# Patient Record
Sex: Male | Born: 1960 | Race: Black or African American | Hispanic: No | Smoking: Former smoker
Health system: Southern US, Community
[De-identification: ages and names within clinical notes are randomized; demographics above are authoritative.]

## PROBLEM LIST (undated history)

## (undated) DIAGNOSIS — I10 Essential (primary) hypertension: Secondary | ICD-10-CM

## (undated) DIAGNOSIS — N19 Unspecified kidney failure: Secondary | ICD-10-CM

## (undated) DIAGNOSIS — E119 Type 2 diabetes mellitus without complications: Secondary | ICD-10-CM

## (undated) DIAGNOSIS — T300 Burn of unspecified body region, unspecified degree: Secondary | ICD-10-CM

## (undated) DIAGNOSIS — F259 Schizoaffective disorder, unspecified: Secondary | ICD-10-CM

## (undated) HISTORY — PX: DIALYSIS FISTULA CREATION: SHX611

## (undated) HISTORY — PX: SKIN GRAFT: SHX250

---

## 2003-04-10 ENCOUNTER — Ambulatory Visit (HOSPITAL_COMMUNITY): Admission: RE | Admit: 2003-04-10 | Discharge: 2003-04-10 | Payer: Self-pay | Admitting: Vascular Surgery

## 2003-04-10 ENCOUNTER — Encounter: Payer: Self-pay | Admitting: Vascular Surgery

## 2003-06-13 ENCOUNTER — Ambulatory Visit (HOSPITAL_COMMUNITY): Admission: RE | Admit: 2003-06-13 | Discharge: 2003-06-13 | Payer: Self-pay | Admitting: Vascular Surgery

## 2003-09-07 ENCOUNTER — Observation Stay (HOSPITAL_COMMUNITY): Admission: EM | Admit: 2003-09-07 | Discharge: 2003-09-07 | Payer: Self-pay | Admitting: Emergency Medicine

## 2003-10-06 ENCOUNTER — Ambulatory Visit (HOSPITAL_COMMUNITY): Admission: RE | Admit: 2003-10-06 | Discharge: 2003-10-06 | Payer: Self-pay | Admitting: Vascular Surgery

## 2003-11-17 ENCOUNTER — Ambulatory Visit (HOSPITAL_COMMUNITY): Admission: RE | Admit: 2003-11-17 | Discharge: 2003-11-17 | Payer: Self-pay | Admitting: Vascular Surgery

## 2003-11-27 ENCOUNTER — Ambulatory Visit (HOSPITAL_COMMUNITY): Admission: RE | Admit: 2003-11-27 | Discharge: 2003-11-27 | Payer: Self-pay | Admitting: Nephrology

## 2004-10-20 ENCOUNTER — Ambulatory Visit (HOSPITAL_COMMUNITY): Admission: RE | Admit: 2004-10-20 | Discharge: 2004-10-20 | Payer: Self-pay | Admitting: *Deleted

## 2004-11-21 ENCOUNTER — Ambulatory Visit (HOSPITAL_BASED_OUTPATIENT_CLINIC_OR_DEPARTMENT_OTHER): Admission: RE | Admit: 2004-11-21 | Discharge: 2004-11-21 | Payer: Self-pay | Admitting: Nephrology

## 2005-01-25 ENCOUNTER — Ambulatory Visit: Payer: Self-pay | Admitting: Internal Medicine

## 2005-01-27 ENCOUNTER — Emergency Department (HOSPITAL_COMMUNITY): Admission: EM | Admit: 2005-01-27 | Discharge: 2005-01-27 | Payer: Self-pay | Admitting: Emergency Medicine

## 2005-03-01 ENCOUNTER — Ambulatory Visit: Payer: Self-pay | Admitting: Internal Medicine

## 2005-04-28 ENCOUNTER — Ambulatory Visit (HOSPITAL_COMMUNITY): Admission: RE | Admit: 2005-04-28 | Discharge: 2005-04-28 | Payer: Self-pay | Admitting: Nephrology

## 2005-07-15 ENCOUNTER — Ambulatory Visit (HOSPITAL_COMMUNITY): Admission: RE | Admit: 2005-07-15 | Discharge: 2005-07-15 | Payer: Self-pay | Admitting: Vascular Surgery

## 2005-08-27 ENCOUNTER — Ambulatory Visit (HOSPITAL_COMMUNITY): Admission: EM | Admit: 2005-08-27 | Discharge: 2005-08-27 | Payer: Self-pay | Admitting: Vascular Surgery

## 2005-09-20 ENCOUNTER — Ambulatory Visit (HOSPITAL_COMMUNITY): Admission: RE | Admit: 2005-09-20 | Discharge: 2005-09-20 | Payer: Self-pay | Admitting: Nephrology

## 2005-09-22 ENCOUNTER — Ambulatory Visit (HOSPITAL_COMMUNITY): Admission: RE | Admit: 2005-09-22 | Discharge: 2005-09-22 | Payer: Self-pay | Admitting: *Deleted

## 2005-12-01 ENCOUNTER — Encounter: Admission: RE | Admit: 2005-12-01 | Discharge: 2005-12-01 | Payer: Self-pay | Admitting: Nephrology

## 2005-12-27 ENCOUNTER — Ambulatory Visit (HOSPITAL_COMMUNITY): Admission: RE | Admit: 2005-12-27 | Discharge: 2005-12-27 | Payer: Self-pay | Admitting: Vascular Surgery

## 2006-02-28 ENCOUNTER — Encounter: Admission: RE | Admit: 2006-02-28 | Discharge: 2006-02-28 | Payer: Self-pay | Admitting: Dentistry

## 2006-02-28 ENCOUNTER — Ambulatory Visit: Payer: Self-pay | Admitting: Dentistry

## 2006-03-02 ENCOUNTER — Ambulatory Visit (HOSPITAL_COMMUNITY): Admission: EM | Admit: 2006-03-02 | Discharge: 2006-03-02 | Payer: Self-pay | Admitting: Dentistry

## 2006-03-27 ENCOUNTER — Ambulatory Visit (HOSPITAL_COMMUNITY): Admission: RE | Admit: 2006-03-27 | Discharge: 2006-03-27 | Payer: Self-pay | Admitting: Vascular Surgery

## 2006-05-09 ENCOUNTER — Ambulatory Visit: Payer: Self-pay | Admitting: Dentistry

## 2006-05-29 ENCOUNTER — Ambulatory Visit (HOSPITAL_COMMUNITY): Admission: RE | Admit: 2006-05-29 | Discharge: 2006-05-29 | Payer: Self-pay | Admitting: Vascular Surgery

## 2006-06-07 ENCOUNTER — Ambulatory Visit (HOSPITAL_COMMUNITY): Admission: RE | Admit: 2006-06-07 | Discharge: 2006-06-07 | Payer: Self-pay | Admitting: Vascular Surgery

## 2006-07-28 ENCOUNTER — Ambulatory Visit (HOSPITAL_COMMUNITY): Admission: RE | Admit: 2006-07-28 | Discharge: 2006-07-28 | Payer: Self-pay | Admitting: Vascular Surgery

## 2006-08-31 ENCOUNTER — Ambulatory Visit (HOSPITAL_COMMUNITY): Admission: RE | Admit: 2006-08-31 | Discharge: 2006-08-31 | Payer: Self-pay | Admitting: Vascular Surgery

## 2007-01-16 ENCOUNTER — Emergency Department (HOSPITAL_COMMUNITY): Admission: EM | Admit: 2007-01-16 | Discharge: 2007-01-16 | Payer: Self-pay | Admitting: Emergency Medicine

## 2007-01-23 ENCOUNTER — Ambulatory Visit: Payer: Self-pay | Admitting: Dentistry

## 2007-06-10 ENCOUNTER — Emergency Department (HOSPITAL_COMMUNITY): Admission: EM | Admit: 2007-06-10 | Discharge: 2007-06-10 | Payer: Self-pay | Admitting: *Deleted

## 2007-12-27 HISTORY — PX: KIDNEY TRANSPLANT: SHX239

## 2009-01-21 ENCOUNTER — Ambulatory Visit: Payer: Self-pay | Admitting: Critical Care Medicine

## 2009-01-21 ENCOUNTER — Inpatient Hospital Stay (HOSPITAL_COMMUNITY): Admission: AD | Admit: 2009-01-21 | Discharge: 2009-01-26 | Payer: Self-pay | Admitting: Pulmonary Disease

## 2009-01-21 ENCOUNTER — Encounter: Payer: Self-pay | Admitting: Emergency Medicine

## 2009-02-09 ENCOUNTER — Encounter: Admission: RE | Admit: 2009-02-09 | Discharge: 2009-03-03 | Payer: Self-pay | Admitting: Pulmonary Disease

## 2009-05-05 ENCOUNTER — Encounter: Admission: RE | Admit: 2009-05-05 | Discharge: 2009-05-05 | Payer: Self-pay | Admitting: Pulmonary Disease

## 2011-01-15 ENCOUNTER — Encounter: Payer: Self-pay | Admitting: Nephrology

## 2011-04-11 LAB — CULTURE, BLOOD (ROUTINE X 2)
Culture: NO GROWTH
Culture: NO GROWTH

## 2011-04-11 LAB — COMPREHENSIVE METABOLIC PANEL
ALT: 20 U/L (ref 0–53)
ALT: 20 U/L (ref 0–53)
AST: 29 U/L (ref 0–37)
Alkaline Phosphatase: 103 U/L (ref 39–117)
BUN: 54 mg/dL — ABNORMAL HIGH (ref 6–23)
CO2: 21 mEq/L (ref 19–32)
CO2: 15 mEq/L — ABNORMAL LOW (ref 19–32)
Calcium: 9.2 mg/dL (ref 8.4–10.5)
Chloride: 113 mEq/L — ABNORMAL HIGH (ref 96–112)
Creatinine, Ser: 2.86 mg/dL — ABNORMAL HIGH (ref 0.4–1.5)
GFR calc non Af Amer: >60 mL/min (ref >60)
GFR calc non Af Amer: 24 mL/min — ABNORMAL LOW (ref >60)
Glucose, Bld: 213 mg/dL — ABNORMAL HIGH (ref 70–99)
Glucose, Bld: 1304 mg/dL (ref 70–99)
Potassium: 3 mEq/L — ABNORMAL LOW (ref 3.5–5.1)
Sodium: 141 mEq/L (ref 135–145)
Total Bilirubin: 0.9 mg/dL (ref 0.3–1.2)
Total Protein: 7.2 g/dL (ref 6.0–8.3)

## 2011-04-11 LAB — BASIC METABOLIC PANEL
BUN: 49 mg/dL — ABNORMAL HIGH (ref 6–23)
BUN: 52 mg/dL — ABNORMAL HIGH (ref 6–23)
BUN: 54 mg/dL — ABNORMAL HIGH (ref 6–23)
CO2: 17 mEq/L — ABNORMAL LOW (ref 19–32)
CO2: 20 mEq/L (ref 19–32)
Calcium: 9.2 mg/dL (ref 8.4–10.5)
Calcium: 9.4 mg/dL (ref 8.4–10.5)
Calcium: 9.4 mg/dL (ref 8.4–10.5)
Calcium: 9.5 mg/dL (ref 8.4–10.5)
Calcium: 9.9 mg/dL (ref 8.4–10.5)
Chloride: 106 mEq/L (ref 96–112)
Chloride: 115 mEq/L — ABNORMAL HIGH (ref 96–112)
Creatinine, Ser: 1.11 mg/dL (ref 0.4–1.5)
Creatinine, Ser: 1.88 mg/dL — ABNORMAL HIGH (ref 0.4–1.5)
Creatinine, Ser: 2.04 mg/dL — ABNORMAL HIGH (ref 0.4–1.5)
Creatinine, Ser: 2.34 mg/dL — ABNORMAL HIGH (ref 0.4–1.5)
Creatinine, Ser: 2.65 mg/dL — ABNORMAL HIGH (ref 0.4–1.5)
GFR calc Af Amer: 31 mL/min — ABNORMAL LOW (ref >60)
GFR calc Af Amer: 47 mL/min — ABNORMAL LOW (ref >60)
GFR calc Af Amer: >60 mL/min (ref >60)
GFR calc non Af Amer: 26 mL/min — ABNORMAL LOW (ref >60)
GFR calc non Af Amer: 30 mL/min — ABNORMAL LOW (ref >60)
GFR calc non Af Amer: 35 mL/min — ABNORMAL LOW (ref >60)
GFR calc non Af Amer: >60 mL/min (ref >60)
Glucose, Bld: 191 mg/dL — ABNORMAL HIGH (ref 70–99)
Glucose, Bld: 451 mg/dL — ABNORMAL HIGH (ref 70–99)
Glucose, Bld: 764 mg/dL (ref 70–99)
Potassium: 3.9 mEq/L (ref 3.5–5.1)
Potassium: 4.2 mEq/L (ref 3.5–5.1)
Sodium: 140 mEq/L (ref 135–145)
Sodium: 143 mEq/L (ref 135–145)
Sodium: 146 mEq/L — ABNORMAL HIGH (ref 135–145)

## 2011-04-11 LAB — CBC
HCT: 43.4 % (ref 39.0–52.0)
Hemoglobin: 13.4 g/dL (ref 13.0–17.0)
Hemoglobin: 16.7 g/dL (ref 13.0–17.0)
MCHC: 33 g/dL (ref 30.0–36.0)
MCHC: 33.3 g/dL (ref 30.0–36.0)
MCHC: 31.1 g/dL (ref 30.0–36.0)
MCV: 85.6 fL (ref 78.0–100.0)
MCV: 85.7 fL (ref 78.0–100.0)
MCV: 91 fL (ref 78.0–100.0)
RBC: 4.69 MIL/uL (ref 4.22–5.81)
RBC: 5.07 MIL/uL (ref 4.22–5.81)
RBC: 5.72 MIL/uL (ref 4.22–5.81)
RBC: 5.92 MIL/uL — ABNORMAL HIGH (ref 4.22–5.81)
RDW: 14.7 % (ref 11.5–15.5)
WBC: 14.1 10*3/uL — ABNORMAL HIGH (ref 4.0–10.5)
WBC: 6.1 10*3/uL (ref 4.0–10.5)

## 2011-04-11 LAB — GLUCOSE, CAPILLARY
Glucose-Capillary: 116 mg/dL — ABNORMAL HIGH (ref 70–99)
Glucose-Capillary: 147 mg/dL — ABNORMAL HIGH (ref 70–99)
Glucose-Capillary: 147 mg/dL — ABNORMAL HIGH (ref 70–99)
Glucose-Capillary: 165 mg/dL — ABNORMAL HIGH (ref 70–99)
Glucose-Capillary: 173 mg/dL — ABNORMAL HIGH (ref 70–99)
Glucose-Capillary: 190 mg/dL — ABNORMAL HIGH (ref 70–99)
Glucose-Capillary: 247 mg/dL — ABNORMAL HIGH (ref 70–99)
Glucose-Capillary: 284 mg/dL — ABNORMAL HIGH (ref 70–99)
Glucose-Capillary: 286 mg/dL — ABNORMAL HIGH (ref 70–99)
Glucose-Capillary: 400 mg/dL — ABNORMAL HIGH (ref 70–99)
Glucose-Capillary: 414 mg/dL — ABNORMAL HIGH (ref 70–99)
Glucose-Capillary: 458 mg/dL — ABNORMAL HIGH (ref 70–99)
Glucose-Capillary: 515 mg/dL (ref 70–99)
Glucose-Capillary: 569 mg/dL (ref 70–99)
Glucose-Capillary: 593 mg/dL (ref 70–99)
Glucose-Capillary: >600 mg/dL (ref 70–99)
Glucose-Capillary: >600 mg/dL (ref 70–99)
Glucose-Capillary: >600 mg/dL (ref 70–99)

## 2011-04-11 LAB — POCT I-STAT 3, ART BLOOD GAS (G3+)
Acid-base deficit: 9 mmol/L — ABNORMAL HIGH (ref 0.0–2.0)
Bicarbonate: 14.9 mEq/L — ABNORMAL LOW (ref 20.0–24.0)
O2 Saturation: 98 %
Patient temperature: 98
TCO2: 16 mmol/L (ref 0–100)
pCO2 arterial: 27.9 mmHg — ABNORMAL LOW (ref 35.0–45.0)
pH, Arterial: 7.334 — ABNORMAL LOW (ref 7.350–7.450)
pO2, Arterial: 102 mmHg — ABNORMAL HIGH (ref 80.0–100.0)

## 2011-04-11 LAB — URINE CULTURE

## 2011-04-11 LAB — URINE MICROSCOPIC-ADD ON

## 2011-04-11 LAB — URINALYSIS, ROUTINE W REFLEX MICROSCOPIC
Bilirubin Urine: NEGATIVE
Ketones, ur: 15 mg/dL — AB
Leukocytes, UA: NEGATIVE
Nitrite: NEGATIVE
Specific Gravity, Urine: 1.025 (ref 1.005–1.030)
Urobilinogen, UA: 0.2 mg/dL (ref 0.0–1.0)
pH: 5.5 (ref 5.0–8.0)

## 2011-04-11 LAB — MAGNESIUM: Magnesium: 1.9 mg/dL (ref 1.5–2.5)

## 2011-04-11 LAB — RENAL FUNCTION PANEL
Albumin: 2.9 g/dL — ABNORMAL LOW (ref 3.5–5.2)
BUN: 38 mg/dL — ABNORMAL HIGH (ref 6–23)
Chloride: 105 mEq/L (ref 96–112)
Glucose, Bld: 538 mg/dL (ref 70–99)
Potassium: 4 mEq/L (ref 3.5–5.1)

## 2011-04-11 LAB — DIFFERENTIAL
Eosinophils Absolute: 0 10*3/uL (ref 0.0–0.7)
Lymphocytes Relative: 5 % — ABNORMAL LOW (ref 12–46)
Lymphs Abs: 0.6 10*3/uL — ABNORMAL LOW (ref 0.7–4.0)
Monocytes Relative: 3 % (ref 3–12)
Neutro Abs: 11.3 10*3/uL — ABNORMAL HIGH (ref 1.7–7.7)
Neutrophils Relative %: 92 % — ABNORMAL HIGH (ref 43–77)

## 2011-04-11 LAB — BLOOD GAS, ARTERIAL
Acid-base deficit: 13.4 mmol/L — ABNORMAL HIGH (ref 0.0–2.0)
Drawn by: 129801
O2 Content: 2 L/min
pCO2 arterial: 28.9 mmHg — ABNORMAL LOW (ref 35.0–45.0)

## 2011-04-11 LAB — CARDIAC PANEL(CRET KIN+CKTOT+MB+TROPI)
Relative Index: 4.2 — ABNORMAL HIGH (ref 0.0–2.5)
Total CK: 261 U/L — ABNORMAL HIGH (ref 7–232)
Troponin I: 0.04 ng/mL (ref 0.00–0.06)
Troponin I: 0.06 ng/mL (ref 0.00–0.06)

## 2011-04-11 LAB — KETONES, QUALITATIVE

## 2011-04-11 LAB — LIPASE, BLOOD: Lipase: 46 U/L (ref 11–59)

## 2011-04-11 LAB — LACTIC ACID, PLASMA: Lactic Acid, Venous: 2.8 mmol/L — ABNORMAL HIGH (ref 0.5–2.2)

## 2011-04-11 LAB — CK TOTAL AND CKMB (NOT AT ARMC)
CK, MB: 5.9 ng/mL — ABNORMAL HIGH (ref 0.3–4.0)
Relative Index: 1.9 (ref 0.0–2.5)
Total CK: 317 U/L — ABNORMAL HIGH (ref 7–232)

## 2011-04-11 LAB — PHOSPHORUS: Phosphorus: 3.1 mg/dL (ref 2.3–4.6)

## 2011-04-11 LAB — TROPONIN I: Troponin I: 0.03 ng/mL (ref 0.00–0.06)

## 2011-04-12 LAB — CBC
HCT: 36.3 % — ABNORMAL LOW (ref 39.0–52.0)
MCV: 85.2 fL (ref 78.0–100.0)
Platelets: 201 10*3/uL (ref 150–400)
RDW: 13.5 % (ref 11.5–15.5)

## 2011-04-12 LAB — BASIC METABOLIC PANEL
BUN: 9 mg/dL (ref 6–23)
Chloride: 110 mEq/L (ref 96–112)
Creatinine, Ser: 1.06 mg/dL (ref 0.4–1.5)
GFR calc non Af Amer: >60 mL/min (ref >60)
Glucose, Bld: 138 mg/dL — ABNORMAL HIGH (ref 70–99)
Potassium: 4 mEq/L (ref 3.5–5.1)

## 2011-04-12 LAB — GLUCOSE, CAPILLARY: Glucose-Capillary: 139 mg/dL — ABNORMAL HIGH (ref 70–99)

## 2011-05-10 NOTE — Discharge Summary (Signed)
[name], Lewis               ACCOUNT NO.:  [id number]   MEDICAL RECORD NO.:  [id number]          PATIENT TYPE:  INP   LOCATION:  5159                         FACILITY:  [facility]   PHYSICIAN:  Marcellus Scott, MD     DATE OF BIRTH:  [birth year]   DATE OF ADMISSION:  01/21/2009  DATE OF DISCHARGE:  01/26/2009                               DISCHARGE SUMMARY   ADDENDUM:  Primary medical doctor is unassigned.   The patient's transplant surgeon is Dr. Rennis Harding at [facility].  His  transplant coordinator is Darel Hong, telephone number [phone].   This is an addendum to the interim discharge summary that was done by  the critical care medicine team on January 23, 2009.  Please refer to  those notes for prior details.  This summary will update events since  January 30 to date.   DISCHARGE DIAGNOSES:  1. Newly diagnosed insulin-requiring diabetes mellitus/type 1.  2. Diabetic ketoacidosis, resolved.  3. Dehydration, resolved.  4. Acute renal failure, non-oliguric, resolved.  5. Hypertension, controlled.  6. Hypokalemia, repleted.  7. Status post renal transplant on chronic prednisone and      immunosuppressive medications.   DISCHARGE MEDICATIONS:  1. Lantus 45 units subcutaneously b.i.d.  2. Novolog 6 units t.i.d. with meals.  3. Novolog sliding scale insulin as per directions.  Resistance scale      prescribed.  4. Lasix reduced to 40 mg p.o. daily until follow-up with transplant      M.D.  5. Prednisone reduced to 5 mg p.o. daily.  6. Cellcept 750 mg p.o. b.i.d.  7. Metoprolol 100 mg p.o. b.i.d.  8. Enteric-coated aspirin 81 mg p.o. daily.  9. Bactrim double-strength 1 tablet p.o. on Monday, Wednesday, Friday.  10.Magnesium oxide 400 mg p.o. b.i.d.  11.Amlodipine 10 mg p.o. daily.  12.Prograf 2 mg p.o. b.i.d.  13.Simvastatin 80 mg p.o. daily.  14.Lisinopril 10 mg p.o. daily.   PROCEDURES:  Since January 30 none.   PERTINENT LABORATORIES:  Basic metabolic panel today with  sodium 142,  potassium 4, chloride 110, bicarb 27, glucose 138, BUN 9, creatinine  1.06, calcium is 9.1.  CBC with hemoglobin 12.3, hematocrit 36.3, white  blood cells 6.3, platelets 201.  Magnesium yesterday was 1.8.  Urine  cultures:  No growth.  Serum lipase 46, amylase 35.  Blood cultures:  No  growth to date x2.   CONSULTATIONS:  None.   HOSPITAL COURSE AND PATIENT DISPOSITION:  [name] is a pleasant 67-  year-old African American male patient who initially was admitted by the  critical care  medicine team.  He presented with polyuria, polydipsia  and polyphagia with diabetic ketoacidosis and acute renal failure  secondary dehydration.  His initial blood sugars were 1300mg /dl.  He was  admitted to the intensive care unit, aggressively hydrated with IV  fluids and treated with IV insulin.  Once his anion gap closed and his  mental status had improved and strength improved, he was taken off the  insulin drip and switched to basal Lantus and Novolog mealtime and  sliding scale insulin.  However, his sugars  continued to be elevated at  the point of transfer to the hospitalist team on January 30.  Further  adjustments were made.  Currently his CBGs range between 134 to 238  mg/dL.  The patient has been extensively educated regarding checking his  CBGs, self-administration of insulin, monitoring for hypoglycemic  symptoms and management of same.  He is currently asymptomatic.  The  etiology of this newly diagnosed diabetes is unclear.  Question  secondary to steroid-induced diabetes.  The critical care team, Dr.  Felipa Eth, discussed his care with Dr. Rennis Harding on January 28.  He was told that  his baseline creatinine was 1.3 and the patient is actually supposed to  be on 5 mg of prednisone.  The patient will be discharged on 5 mg of  prednisone.  We were also made aware per the interim discharge summary  that [facility] will manage diabetic control and the rest of his medical care.  As indicated,  he has to call the transplant coordinator as soon as  possible for an appointment to be followed up by the end of this week or  early next week.   Diabetic ketoacidosis, resolved.   Dehydration, resolved.   Acute renal failure.  The patient's creatinine was much improved.  He  apparently has a baseline creatinine of 1.3.  His Lasix and ACE  inhibitors were obviously held.  Currently he is euvolemic and eating  and drinking well.  We will re-institute lisinopril and Lasix at a  reduced dose until he follows up with his transplant MDs.   Status post renal transplant.  The patient has continued on his  immunosuppressive medications, including Prograf, Cellcept, prednisone.  He is also continued on p.o. Bactrim.   The patient at this time is stable to discharge  home.  He has been  advised to seek immediate medical attention if there is any  deterioration in his condition.   Time Spent: 40 mins      Marcellus Scott, MD  Electronically Signed     AH/MEDQ  D:  01/26/2009  T:  01/26/2009  Job:  [id number]   cc:   Lannie Fields

## 2011-05-10 NOTE — Discharge Summary (Signed)
NAMEZACKARIAH, [name]               ACCOUNT NO.:  [id number]   MEDICAL RECORD NO.:  [id number]          PATIENT TYPE:  INP   LOCATION:  5159                         FACILITY:  [facility]   PHYSICIAN:  Coralyn Helling, MD        DATE OF BIRTH:  [birth year]   DATE OF ADMISSION:  01/21/2009  DATE OF DISCHARGE:                               DISCHARGE SUMMARY   DISCHARGE DIAGNOSES:  1. New-onset diabetes with profound hyperglycemia and resultant      diabetic ketoacidosis.  2. Profound dehydration secondary to history of renal transplant.  3. History of renal transplant.  4. Hypertension.   LABORATORY DATA:  Blood glucose fasting on January 23, 2009, 360.  At  bedtime glucose on January 22, 2009, 569.  Note, this is after  transitioning off from insulin drips.  Sodium 132, potassium 4, chloride  105, CO2 16, creatinine 1.81 down from 1.88, glucose fasting at 538,  magnesium 2.5, and BUN 38.   BRIEF HISTORY:  A 50 year old male patient presented with chief  complaint of weakness, polyuria, polydipsia, and polyphagia.  The  symptoms were progressive over approximately a week.  Initial glucose  found to be 1300, in active diabetic ketoacidosis by laboratory  evaluation.  He has a significant history of end-stage renal disease,  formally dialysis dependent.  However, status post renal transplant in  2009 at [facility].  He does not see physicians  locally, however, is followed at [facility].  At any rate, he was admitted to  the Critical Care Service given his profound dehydration and metabolic  acidosis.   HOSPITAL COURSE BY DISCHARGE DIAGNOSES:  1. Diabetes type 2 with profound hyperglycemia, and now status post      diabetic ketoacidosis.  Mr. [name] was admitted initially to the      intensive care and treated in the usual fashion with aggressive IV      fluid resuscitation and insulin administration.  His anion gap      closed,  his mental status improved, strength improved,  and      glycemic control slowly improving.  Of note, following      discontinuation of insulin drip, there has been some difficulty      controlling his capillary blood glucoses, most specifically      capillary blood glucose prior to bedtime of 569 and a fasting      capillary blood glucose of 360.  This was noted on the day of      dictation.  Plan for this at this time is to continue to adjust      basal and sliding scale insulin regimen.  Additionally, we will      check amylase and lipase to be certain that there is an occult      pancreatitis contributing to new onset of hyperglycemia.  He has      yet to initiate insulin teaching and will require injection      teaching, dose teaching, and education around all facets of insulin      administration.  2. Status post renal transplant.  For this, his creatinine did      marginally bump.  He has a baseline creatinine noted in our records      at 1.3.  He is 1.81, however, this has improved with IV fluid      resuscitation following profound dehydration as mentioned above.      From a General Medicine and Nephrology standpoint,  He will be      followed at [facility] by their transplant      team.  For this, we have already been in contact with [facility].  He is      seen by Dr. Rennis Harding who is a transplant surgeon.  He is to be      discharged to home on 5 mg of prednisone a day.  He will need to      contact Darel Hong, the transplant coordinator at [phone] upon time      of discharge for followup.  [facility] is planning on managing glycemic      control in addition to all facets of his medical care upon time of      discharge.  3. Hypertension.  Plan for this is to continue current regimen.   DIET:  Carbohydrate modified, with focus on glycemic teaching.   DISCHARGE MEDICATIONS:  Currently, we will defer this to time of actual  discharge as it is unclear what his insulin requirements will be.   DISPOSITION:  Mr. Fiumara has  made maximum benefit from Critical Care  Services.  He continues to require Internal Medicine assistance and  therefore he will be transitioned to the [facility] Internal Medicine  Service.      [name], NP      Coralyn Helling, MD  Electronically Signed    PB/MEDQ  D:  01/23/2009  T:  01/23/2009  Job:  [id number]

## 2011-05-10 NOTE — H&P (Signed)
[name], [name]               ACCOUNT NO.:  [id number]   MEDICAL RECORD NO.:  [id number]          PATIENT TYPE:  EMS   LOCATION:  ED                           FAC[facility]  [facility]   PHYSICIAN:  Charlcie Cradle. Delford Field, MD, FCCPDATE OF BIRTH:  12/13/1961   DATE OF ADMISSION:  01/21/2009  DATE OF DISCHARGE:                              HISTORY & PHYSICAL   HISTORY OF PRESENT ILLNESS:  This is a 50 year old male who presents  with chief complaint of weakness.  He has not been eating for several  days.  He has noted polyuria, dipsia, and polyphagia.  His symptoms  began progressively over a week.  Symptoms were onset was gradual.  He  developed increased lethargy.  Family brought him in and was found to  have a blood sugar of 1300 in active DKA.  History of chronic renal  failure with end-stage renal disease, dialysis dependent in the past,  but in April 2009 he had a renal transplant at [facility],  cadaveric.  He is now off dialysis, is followed exclusively in the [facility]  system, does not see physicians locally here.  He came to the [facility] emergency room because of progressive lethargy and found to have to  have a blood sugar of 1300.  Admitted for inpatient care because of low  blood pressure.  The critical care medicine service was consulted.   PAST MEDICAL HISTORY:  1. Hypertension.  2. Hyperlipidemia.  3. End-stage renal disease on the basis of hypertension.  4. Chronic access graft issues for many years in the right arm.  5. Right IJ access.   ALLERGIES:  None.   MEDICATION PRIOR TO ADMISSION:  1. Prednisone 10 mg daily.  2. Prograf 10 mg b.i.d.  3. CellCept 750 mg b.i.d.  4. Magnesium supplementation.  5. Prilosec 20 mg daily.  6. Zocor 80 mg daily.  7. Aspirin 81 mg daily.  8. Norvasc 10 mg daily.  9. Lisinopril 10 mg daily.   Medical history is notable in that patient does not have a history of  diabetes.   SOCIAL HISTORY, FAMILY HISTORY, REVIEW OF  SYSTEMS:  Otherwise  noncontributory.  Patient lives at home by himself.   PHYSICAL EXAMINATION:  This is an ill-appearing male.  Blood pressure  initially was 78/47, now 90/60.  Pulse 90, respirations 24.  CHEST:  Distant breath sounds.  No wheeze, rale or rhonchi.  CARDIAC:  Regular rate and rhythm without S3.  Normal S1 and S2.  HEENT:  Oral mucous membranes are dry.  NECK:  Supple.  CHEST:  Clear without evidence of wheeze, rale, or rhonchi.  CARDIAC:  Regular rate and rhythm.  Tachycardia without S3.  Normal S1,  S2.  ABDOMEN:  Soft, nontender.  EXTREMITIES:  No edema, clubbing or obesity.  SKIN:  Clear.  NEUROLOGIC:  Patient is lethargic but intact.  Does smell like acetone.   LABORATORY DATA:  Sodium 130, potassium 5.5, chloride 91, CO2 15, blood  sugar 1300, BUN 54, creatinine 2.86, calcium 9.2.  Chest x-ray was  clear.  No active disease.  EKG is not yet obtained.   IMPRESSION:  Diabetic ketoacidosis with metabolic acidosis, severe  volume depletion, renal insufficiency on the basis of volume depletion.  No diabetes history is noted.  Underlying renal transplant with  creatinine of 3 now.  End-stage renal disease in the past due to  hypertension.  Does have leukocytosis.   RECOMMENDATIONS:  Check urinalysis.  Check urine culture, blood culture.  No antibiotics as of yet.  Get full volume support with IV fluids,  saline.  DKA protocol.  Admit to ICU at [facility].  Obtain renal  consultation in followup.  Administer and obtain complete active  medication reconciliation.  Notify the [facility].      Charlcie Cradle Delford Field, MD, [facility]  Electronically Signed     PEW/MEDQ  D:  01/21/2009  T:  01/21/2009  Job:  [phone] [id number]   cc:   [facility] Department of Renal Medicine

## 2011-05-13 NOTE — Procedures (Signed)
NAMEMERLIN, GOLDEN               ACCOUNT NO.:  [id number]   MEDICAL RECORD NO.:  [id number]          PATIENT TYPE:  OUT   LOCATION:  SLEEP CENTER                 FACILITY:  [facility]   PHYSICIAN:  Clinton D. Maple Hudson, M.D. DATE OF BIRTH:  08/12/1961   DATE OF STUDY:  11/21/2004                              NOCTURNAL POLYSOMNOGRAM   STUDY DATE:  November 21, 2004   REFERRING PHYSICIAN:  Dr. Camille Bal   INDICATION FOR STUDY:  Hypersomnia with sleep apnea.   EPWORTH SLEEPINESS SCORE:  9/24   BODY MASS INDEX:  27.5   WEIGHT:  215 pounds   NECK SIZE:  17-1/2 inches   SLEEP ARCHITECTURE:  Total sleep time 314 minutes with sleep efficiency 66%.  Stage I was 5%, stage II 69%, stages III and IV 3%, REM was 23% of total  sleep time.  Sleep latency 55 minutes, REM latency 137 minutes, awake after  sleep onset 110 minutes, arousal index 30.   RESPIRATORY DATA:  Split-study protocol.  RDI 77.8/hr indicating severe  obstructive sleep apnea/hypopnea before CPAP.  This included 49 obstructive  apneas and 126 hypopneas before CPAP.  Events were not positional.  REM RDI  13.9/hr.  CPAP is titrated to 8 CWP, RDI 0.4/hr using a medium ComfortGel  Mask with heated humidifier.   OXYGEN DATA:  Mild snoring with oxygen desaturation to a nadir of 73% before  CPAP.  After CPAP titration oxygen saturation held 96-98% on room air.   CARDIAC DATA:  Normal sinus rhythm.   MOVEMENT/PARASOMNIA:  Occasional leg jerk with insignificant effect on  sleep.  Bathroom x2.   IMPRESSION/RECOMMENDATION:  Severe obstructive sleep apnea/hypopnea  syndrome, respiratory disturbance index 77.8/hr with desaturation to 73%.  Continuous positive airway pressure titration to 8 CWP, respiratory  disturbance index 0.4/hr using a medium ComfortGel Mask with heated  humidifier.                                                           Clinton D. Maple Hudson, M.D.  Diplomate, [employer]   CDY/MEDQ  D:  11/28/2004  10:26:29  T:  11/28/2004 11:52:29  Job:  [id number]

## 2011-05-13 NOTE — Op Note (Signed)
[name], [name]               ACCOUNT NO.:  [id number]   MEDICAL RECORD NO.:  [id number]          PATIENT TYPE:  AMB   LOCATION:  SDS                          FACILITY:  [facility]   PHYSICIAN:  Quita Skye. Hart Rochester, M.D.  DATE OF BIRTH:  1961-04-12   DATE OF PROCEDURE:  07/28/2006  DATE OF DISCHARGE:                                 OPERATIVE REPORT   PREOPERATIVE DIAGNOSIS:  End-stage renal disease.   POSTOPERATIVE DIAGNOSIS:  End-stage renal disease.   OPERATION:  Insertion of a new right upper arm arteriovenous Gore-Tex graft  from brachial artery to axillary vein of 6 mm Gore-Tex.   SURGEON:  Quita Skye. Hart Rochester, M.D.   FIRST ASSISTANT:  Theda Belfast, PA   ANESTHESIA:  Local.   PROCEDURE:  The patient was taken to the operating room and placed in the  supine position, at which time the right upper extremity was prepped with  Betadine scrub and solution and draped in a routine sterile manner.  After  infiltration with 1% Xylocaine, a longitudinal incision was made the distal  upper arm over the brachial artery.  The brachial artery was a small vessel,  somewhat thickened and it was dissected free proximally and distally but did  have an excellent pulse.  There was no evidence of any adequate basilic vein  in this area.  A second incision was made just distal to the axilla and the  axillary vein was dissected free down to the high brachial level and was a  4.5-5 mm vein, adequate for grafting.  A curvilinear tunnel was created on  the anterior aspect of the arm after infiltration of 0.5% Xylocaine and 3000  units of heparin given intravenously.  A 6 mm Gore-Tex graft was delivered  through the tunnel.  The artery was occluded proximally and distally with  vessel loops, opened with a 15 blade, extended with Potts scissors.  The 6  mm graft anastomosed end-to-side with 6-0 Prolene.  Following this the vein  was ligated distally and transected, slightly spatulated, and the graft  was  spatulated and anastomosed end-to-end with 6-0 Prolene.  Clamps were  released.  There was an excellent pulse and thrill in the graft and good  brachial pulse distal to the graft.  No protamine was given.  The wounds  were irrigated with saline, closed in layers with Vicryl in a subcuticular  fashion, sterile dressing applied.  The patient taken to the recovery room  in satisfactory condition.           ______________________________  Quita Skye Hart Rochester, M.D.     JDL/MEDQ  D:  07/28/2006  T:  07/28/2006  Job:  [id number]

## 2013-10-04 DIAGNOSIS — Z940 Kidney transplant status: Secondary | ICD-10-CM | POA: Diagnosis not present

## 2013-10-04 DIAGNOSIS — I10 Essential (primary) hypertension: Secondary | ICD-10-CM | POA: Diagnosis not present

## 2014-03-12 DIAGNOSIS — Z8639 Personal history of other endocrine, nutritional and metabolic disease: Secondary | ICD-10-CM | POA: Diagnosis not present

## 2014-03-12 DIAGNOSIS — Z940 Kidney transplant status: Secondary | ICD-10-CM | POA: Diagnosis not present

## 2014-03-12 DIAGNOSIS — D899 Disorder involving the immune mechanism, unspecified: Secondary | ICD-10-CM | POA: Diagnosis not present

## 2014-03-12 DIAGNOSIS — I129 Hypertensive chronic kidney disease with stage 1 through stage 4 chronic kidney disease, or unspecified chronic kidney disease: Secondary | ICD-10-CM | POA: Diagnosis not present

## 2014-03-12 DIAGNOSIS — Z862 Personal history of diseases of the blood and blood-forming organs and certain disorders involving the immune mechanism: Secondary | ICD-10-CM | POA: Diagnosis not present

## 2014-03-12 DIAGNOSIS — E785 Hyperlipidemia, unspecified: Secondary | ICD-10-CM | POA: Diagnosis not present

## 2014-05-01 ENCOUNTER — Encounter (HOSPITAL_COMMUNITY): Payer: Self-pay | Admitting: Emergency Medicine

## 2014-05-01 ENCOUNTER — Emergency Department (HOSPITAL_COMMUNITY): Payer: Medicare Other

## 2014-05-01 ENCOUNTER — Emergency Department (HOSPITAL_COMMUNITY)
Admission: EM | Admit: 2014-05-01 | Discharge: 2014-05-01 | Disposition: A | Discharge status: Home / Self Care | Payer: Medicare Other | Attending: Emergency Medicine | Admitting: Emergency Medicine

## 2014-05-01 DIAGNOSIS — M5430 Sciatica, unspecified side: Secondary | ICD-10-CM | POA: Insufficient documentation

## 2014-05-01 DIAGNOSIS — I120 Hypertensive chronic kidney disease with stage 5 chronic kidney disease or end stage renal disease: Secondary | ICD-10-CM | POA: Diagnosis not present

## 2014-05-01 DIAGNOSIS — Z992 Dependence on renal dialysis: Secondary | ICD-10-CM | POA: Insufficient documentation

## 2014-05-01 DIAGNOSIS — N186 End stage renal disease: Secondary | ICD-10-CM | POA: Diagnosis not present

## 2014-05-01 DIAGNOSIS — Z940 Kidney transplant status: Secondary | ICD-10-CM | POA: Diagnosis not present

## 2014-05-01 DIAGNOSIS — S335XXA Sprain of ligaments of lumbar spine, initial encounter: Secondary | ICD-10-CM | POA: Diagnosis not present

## 2014-05-01 DIAGNOSIS — I10 Essential (primary) hypertension: Secondary | ICD-10-CM | POA: Diagnosis not present

## 2014-05-01 DIAGNOSIS — Z79899 Other long term (current) drug therapy: Secondary | ICD-10-CM | POA: Diagnosis not present

## 2014-05-01 DIAGNOSIS — E119 Type 2 diabetes mellitus without complications: Secondary | ICD-10-CM | POA: Insufficient documentation

## 2014-05-01 DIAGNOSIS — X503XXA Overexertion from repetitive movements, initial encounter: Secondary | ICD-10-CM | POA: Insufficient documentation

## 2014-05-01 DIAGNOSIS — T148XXA Other injury of unspecified body region, initial encounter: Secondary | ICD-10-CM

## 2014-05-01 DIAGNOSIS — M4310 Spondylolisthesis, site unspecified: Secondary | ICD-10-CM | POA: Diagnosis not present

## 2014-05-01 DIAGNOSIS — Z87828 Personal history of other (healed) physical injury and trauma: Secondary | ICD-10-CM | POA: Insufficient documentation

## 2014-05-01 DIAGNOSIS — IMO0002 Reserved for concepts with insufficient information to code with codable children: Secondary | ICD-10-CM | POA: Diagnosis not present

## 2014-05-01 DIAGNOSIS — Y929 Unspecified place or not applicable: Secondary | ICD-10-CM | POA: Insufficient documentation

## 2014-05-01 DIAGNOSIS — Y9389 Activity, other specified: Secondary | ICD-10-CM | POA: Insufficient documentation

## 2014-05-01 HISTORY — DX: Burn of unspecified body region, unspecified degree: T30.0

## 2014-05-01 HISTORY — DX: Type 2 diabetes mellitus without complications: E11.9

## 2014-05-01 HISTORY — DX: Essential (primary) hypertension: I10

## 2014-05-01 HISTORY — DX: Unspecified kidney failure: N19

## 2014-05-01 MED ORDER — OXYCODONE-ACETAMINOPHEN 5-325 MG PO TABS: 1.0000 | ORAL_TABLET | Freq: Four times a day (QID) | ORAL | Status: DC | PRN

## 2014-05-01 MED ORDER — OXYCODONE-ACETAMINOPHEN 5-325 MG PO TABS
1.0000 | ORAL_TABLET | Freq: Once | ORAL | Status: AC
  Administered 2014-05-01: 1 via ORAL
  Filled 2014-05-01: qty 1

## 2014-05-01 MED ORDER — PREDNISONE 50 MG PO TABS: 50.0000 mg | ORAL_TABLET | Freq: Every day | ORAL | Status: DC

## 2014-05-01 NOTE — ED Provider Notes (Signed)
CSN: [id number]     Arrival date & time 05/01/14  [id number] History   First MD Initiated Contact with Patient 05/01/14 0354     Chief Complaint  Patient presents with  . Back Pain     (Consider location/radiation/quality/duration/timing/severity/associated sxs/prior Treatment) HPI  This a 53 year old male with a history of renal failure status post transplant, diabetes, and hypertension who presents with back pain. Patient reports that approximately 20 days ago he was doing some lifting and he had onset of pain. Pain radiates down his right leg. He has been taking Aleve without relief. Patient currently rates his pain at 8/10. He denies any difficulty with his bowel or bladder.  Past Medical History  Diagnosis Date  . Renal failure   . Diabetes mellitus without complication   . Hypertension   . Burn    Past Surgical History  Procedure Laterality Date  . Kidney transplant Right 2009  . Dialysis fistula creation Right   . Skin graft     No family history on file. History  Substance Use Topics  . Smoking status: Never Smoker   . Smokeless tobacco: Not on file  . Alcohol Use: Yes     Comment: occ    Review of Systems  Constitutional: Negative.  Negative for fever.  Respiratory: Negative.  Negative for chest tightness and shortness of breath.   Cardiovascular: Negative.  Negative for chest pain.  Gastrointestinal: Negative.   Genitourinary: Negative.  Negative for difficulty urinating.  Musculoskeletal: Positive for back pain.  All other systems reviewed and are negative.     Allergies  Review of patient's allergies indicates no known allergies.  Home Medications   Prior to Admission medications   Medication Sig Start Date End Date Taking? Authorizing Provider  amLODipine (NORVASC) 10 MG tablet Take 10 mg by mouth daily.   Yes Historical Provider, MD  furosemide (LASIX) 20 MG tablet Take 20 mg by mouth daily.   Yes Historical Provider, MD  metoprolol (LOPRESSOR) 50 MG  tablet Take 50 mg by mouth 2 (two) times daily.   Yes Historical Provider, MD  mycophenolate (CELLCEPT) 250 MG capsule Take 750 mg by mouth 2 (two) times daily.   Yes Historical Provider, MD  naproxen sodium (ANAPROX) 220 MG tablet Take 220 mg by mouth 2 (two) times daily as needed (pain).   Yes Historical Provider, MD  omeprazole (PRILOSEC) 20 MG capsule Take 20 mg by mouth daily.   Yes Historical Provider, MD  predniSONE (DELTASONE) 10 MG tablet Take 10 mg by mouth daily with breakfast.   Yes Historical Provider, MD  tacrolimus (PROGRAF) 1 MG capsule Take 3 mg by mouth 2 (two) times daily.   Yes Historical Provider, MD  oxyCODONE-acetaminophen (PERCOCET/ROXICET) 5-325 MG per tablet Take 1 tablet by mouth every 6 (six) hours as needed for severe pain. 05/01/14   Shon Batonourtney F Lyann Hagstrom, MD   BP 142/85  Pulse 74  Temp(Src) 97.5 F (36.4 C) (Oral)  Resp 20  Ht 6' (1.829 m)  Wt 193 lb (87.544 kg)  BMI 26.17 kg/m2  SpO2 97% Physical Exam  Nursing note and vitals reviewed. Constitutional: He is oriented to person, place, and time. He appears well-developed and well-nourished. No distress.  HENT:  Head: Normocephalic and atraumatic.  Cardiovascular: Normal rate, regular rhythm and normal heart sounds.   No murmur heard. Pulmonary/Chest: Effort normal and breath sounds normal. No respiratory distress. He has no wheezes.  Musculoskeletal: He exhibits no edema.  Lymphadenopathy:    He  has no cervical adenopathy.  Neurological: He is alert and oriented to person, place, and time.  2+ bilateral patellar reflexes, 5 out of 5 strength in dorsi and plantar flexion, 5 out of 5 strength with hip flexion, negative straight leg raise bilaterally  Skin: Skin is warm and dry.  Old fistula site noted in right upper extremity  Psychiatric: He has a normal mood and affect.    ED Course  Procedures (including critical care time) Labs Review Labs Reviewed - No data to display  Imaging Review Dg Lumbar Spine  Complete  05/01/2014   CLINICAL DATA:  Back pain.  EXAM: LUMBAR SPINE - COMPLETE 4+ VIEW  COMPARISON:  None.  FINDINGS: No acute fracture, osseous erosion, or bone lesion.  Grade 1 L4-5 anterolisthesis with at least unilateral (left) pars defect at L4. No significant degenerative narrowing.  IMPRESSION: 1. No acute osseous findings. 2. 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   Electronically Signed   By: Tiburcio PeaJonathan  Watts M.D.   On: 05/01/2014 05:00     EKG Interpretation None      MDM   Final diagnoses:  Musculoskeletal strain  Sciatica    Patient presents with back pain that radiates into his right leg. He is nontoxic on exam. Neurologic exam is reassuring and no signs of cauda equina. Patient's history is concerning for sciatica. X-ray without acute fracture but does show anterior listhesis. Patient was given Norco. He is still complaining of pain. Will sent home with Percocet and prednisone for musculoskeletal strain and sciatica. Patient was given strict return precautions.  After history, exam, and medical workup I feel the patient has been appropriately medically screened and is safe for discharge home. Pertinent diagnoses were discussed with the patient. Patient was given return precautions.     Shon Batonourtney F Copelan Maultsby, MD 05/01/14 [phone]

## 2014-05-01 NOTE — Discharge Instructions (Signed)
Lumbosacral Strain °Lumbosacral strain is a strain of any of the parts that make up your lumbosacral vertebrae. Your lumbosacral vertebrae are the bones that make up the lower third of your backbone. Your lumbosacral vertebrae are held together by muscles and tough, fibrous tissue (ligaments).  °CAUSES  °A sudden blow to your back can cause lumbosacral strain. Also, anything that causes an excessive stretch of the muscles in the low back can cause this strain. This is typically seen when people exert themselves strenuously, fall, lift heavy objects, bend, or crouch repeatedly. °RISK FACTORS °· Physically demanding work. °· Participation in pushing or pulling sports or sports that require sudden twist of the back (tennis, golf, baseball). °· Weight lifting. °· Excessive lower back curvature. °· Forward-tilted pelvis. °· Weak back or abdominal muscles or both. °· Tight hamstrings. °SIGNS AND SYMPTOMS  °Lumbosacral strain may cause pain in the area of your injury or pain that moves (radiates) down your leg.  °DIAGNOSIS °Your health care provider can often diagnose lumbosacral strain through a physical exam. In some cases, you may need tests such as X-ray exams.  °TREATMENT  °Treatment for your lower back injury depends on many factors that your clinician will have to evaluate. However, most treatment will include the use of anti-inflammatory medicines. °HOME CARE INSTRUCTIONS  °· Avoid hard physical activities (tennis, racquetball, waterskiing) if you are not in proper physical condition for it. This may aggravate or create problems. °· If you have a back problem, avoid sports requiring sudden body movements. Swimming and walking are generally safer activities. °· Maintain good posture. °· Maintain a healthy weight. °· For acute conditions, you may put ice on the injured area. °· Put ice in a plastic bag. °· Place a towel between your skin and the bag. °· Leave the ice on for 20 minutes, 2 3 times a day. °· When the  low back starts healing, stretching and strengthening exercises may be recommended. °SEEK MEDICAL CARE IF: °· Your back pain is getting worse. °· You experience severe back pain not relieved with medicines. °SEEK IMMEDIATE MEDICAL CARE IF:  °· You have numbness, tingling, weakness, or problems with the use of your arms or legs. °· There is a change in bowel or bladder control. °· You have increasing pain in any area of the body, including your belly (abdomen). °· You notice shortness of breath, dizziness, or feel faint. °· You feel sick to your stomach (nauseous), are throwing up (vomiting), or become sweaty. °· You notice discoloration of your toes or legs, or your feet get very cold. °MAKE SURE YOU:  °· Understand these instructions. °· Will watch your condition. °· Will get help right away if you are not doing well or get worse. °Document Released: 09/21/2005 Document Revised: 10/02/2013 Document Reviewed: 07/31/2013 °[employer]® Patient Information ©2014 [employer]. ° °Sciatica °Sciatica is pain, weakness, numbness, or tingling along the path of the sciatic nerve. The nerve starts in the lower back and runs down the back of each leg. The nerve controls the muscles in the lower leg and in the back of the knee, while also providing sensation to the back of the thigh, lower leg, and the sole of your foot. Sciatica is a symptom of another medical condition. For instance, nerve damage or certain conditions, such as a herniated disk or bone spur on the spine, pinch or put pressure on the sciatic nerve. This causes the pain, weakness, or other sensations normally associated with sciatica. Generally, sciatica only affects   one side of the body. °CAUSES  °· Herniated or slipped disc. °· Degenerative disk disease. °· A pain disorder involving the narrow muscle in the buttocks (piriformis syndrome). °· Pelvic injury or fracture. °· Pregnancy. °· Tumor (rare). °SYMPTOMS  °Symptoms can vary from mild to very severe. The  symptoms usually travel from the low back to the buttocks and down the back of the leg. Symptoms can include: °· Mild tingling or dull aches in the lower back, leg, or hip. °· Numbness in the back of the calf or sole of the foot. °· Burning sensations in the lower back, leg, or hip. °· Sharp pains in the lower back, leg, or hip. °· Leg weakness. °· Severe back pain inhibiting movement. °These symptoms may get worse with coughing, sneezing, laughing, or prolonged sitting or standing. Also, being overweight may worsen symptoms. °DIAGNOSIS  °Your caregiver will perform a physical exam to look for common symptoms of sciatica. He or she may ask you to do certain movements or activities that would trigger sciatic nerve pain. Other tests may be performed to find the cause of the sciatica. These may include: °· Blood tests. °· X-rays. °· Imaging tests, such as an MRI or CT scan. °TREATMENT  °Treatment is directed at the cause of the sciatic pain. Sometimes, treatment is not necessary and the pain and discomfort goes away on its own. If treatment is needed, your caregiver may suggest: °· Over-the-counter medicines to relieve pain. °· Prescription medicines, such as anti-inflammatory medicine, muscle relaxants, or narcotics. °· Applying heat or ice to the painful area. °· Steroid injections to lessen pain, irritation, and inflammation around the nerve. °· Reducing activity during periods of pain. °· Exercising and stretching to strengthen your abdomen and improve flexibility of your spine. Your caregiver may suggest losing weight if the extra weight makes the back pain worse. °· Physical therapy. °· Surgery to eliminate what is pressing or pinching the nerve, such as a bone spur or part of a herniated disk. °HOME CARE INSTRUCTIONS  °· Only take over-the-counter or prescription medicines for pain or discomfort as directed by your caregiver. °· Apply ice to the affected area for 20 minutes, 3 4 times a day for the first 48 72  hours. Then try heat in the same way. °· Exercise, stretch, or perform your usual activities if these do not aggravate your pain. °· Attend physical therapy sessions as directed by your caregiver. °· Keep all follow-up appointments as directed by your caregiver. °· Do not wear high heels or shoes that do not provide proper support. °· Check your mattress to see if it is too soft. A firm mattress may lessen your pain and discomfort. °SEEK IMMEDIATE MEDICAL CARE IF:  °· You lose control of your bowel or bladder (incontinence). °· You have increasing weakness in the lower back, pelvis, buttocks, or legs. °· You have redness or swelling of your back. °· You have a burning sensation when you urinate. °· You have pain that gets worse when you lie down or awakens you at night. °· Your pain is worse than you have experienced in the past. °· Your pain is lasting longer than 4 weeks. °· You are suddenly losing weight without reason. °MAKE SURE YOU: °· Understand these instructions. °· Will watch your condition. °· Will get help right away if you are not doing well or get worse. °Document Released: 12/06/2001 Document Revised: 06/12/2012 Document Reviewed: 04/22/2012 °[employer]® Patient Information ©2014 [employer]. ° °

## 2014-05-01 NOTE — ED Notes (Signed)
Pt c/o right lower back pain radiating down R leg x 19 days after lifting items. Pt states he has taking aleve with no relief.

## 2014-08-01 DIAGNOSIS — I129 Hypertensive chronic kidney disease with stage 1 through stage 4 chronic kidney disease, or unspecified chronic kidney disease: Secondary | ICD-10-CM | POA: Diagnosis not present

## 2014-08-01 DIAGNOSIS — D899 Disorder involving the immune mechanism, unspecified: Secondary | ICD-10-CM | POA: Diagnosis not present

## 2014-08-01 DIAGNOSIS — Z8639 Personal history of other endocrine, nutritional and metabolic disease: Secondary | ICD-10-CM | POA: Diagnosis not present

## 2014-08-01 DIAGNOSIS — Z862 Personal history of diseases of the blood and blood-forming organs and certain disorders involving the immune mechanism: Secondary | ICD-10-CM | POA: Diagnosis not present

## 2014-08-01 DIAGNOSIS — Z940 Kidney transplant status: Secondary | ICD-10-CM | POA: Diagnosis not present

## 2014-08-01 DIAGNOSIS — E785 Hyperlipidemia, unspecified: Secondary | ICD-10-CM | POA: Diagnosis not present

## 2014-11-28 DIAGNOSIS — E785 Hyperlipidemia, unspecified: Secondary | ICD-10-CM | POA: Diagnosis not present

## 2014-11-28 DIAGNOSIS — Z8639 Personal history of other endocrine, nutritional and metabolic disease: Secondary | ICD-10-CM | POA: Diagnosis not present

## 2014-11-28 DIAGNOSIS — Z23 Encounter for immunization: Secondary | ICD-10-CM | POA: Diagnosis not present

## 2014-11-28 DIAGNOSIS — I129 Hypertensive chronic kidney disease with stage 1 through stage 4 chronic kidney disease, or unspecified chronic kidney disease: Secondary | ICD-10-CM | POA: Diagnosis not present

## 2014-11-28 DIAGNOSIS — D899 Disorder involving the immune mechanism, unspecified: Secondary | ICD-10-CM | POA: Diagnosis not present

## 2014-11-28 DIAGNOSIS — Z940 Kidney transplant status: Secondary | ICD-10-CM | POA: Diagnosis not present

## 2014-12-04 ENCOUNTER — Encounter (HOSPITAL_COMMUNITY): Payer: Self-pay | Admitting: Emergency Medicine

## 2014-12-04 ENCOUNTER — Emergency Department (HOSPITAL_COMMUNITY)
Admission: EM | Admit: 2014-12-04 | Discharge: 2014-12-04 | Disposition: A | Discharge status: Home / Self Care | Payer: Medicare Other | Attending: Emergency Medicine | Admitting: Emergency Medicine

## 2014-12-04 DIAGNOSIS — K088 Other specified disorders of teeth and supporting structures: Secondary | ICD-10-CM | POA: Insufficient documentation

## 2014-12-04 DIAGNOSIS — Z79899 Other long term (current) drug therapy: Secondary | ICD-10-CM | POA: Diagnosis not present

## 2014-12-04 DIAGNOSIS — E119 Type 2 diabetes mellitus without complications: Secondary | ICD-10-CM | POA: Insufficient documentation

## 2014-12-04 DIAGNOSIS — Z87448 Personal history of other diseases of urinary system: Secondary | ICD-10-CM | POA: Diagnosis not present

## 2014-12-04 DIAGNOSIS — K029 Dental caries, unspecified: Secondary | ICD-10-CM | POA: Diagnosis not present

## 2014-12-04 DIAGNOSIS — Z7952 Long term (current) use of systemic steroids: Secondary | ICD-10-CM | POA: Insufficient documentation

## 2014-12-04 DIAGNOSIS — I10 Essential (primary) hypertension: Secondary | ICD-10-CM | POA: Diagnosis not present

## 2014-12-04 DIAGNOSIS — K0889 Other specified disorders of teeth and supporting structures: Secondary | ICD-10-CM

## 2014-12-04 MED ORDER — PENICILLIN V POTASSIUM 500 MG PO TABS: 500.0000 mg | ORAL_TABLET | Freq: Four times a day (QID) | ORAL | Status: AC

## 2014-12-04 MED ORDER — NAPROXEN 500 MG PO TABS
500.0000 mg | ORAL_TABLET | Freq: Once | ORAL | Status: AC
  Administered 2014-12-04: 500 mg via ORAL
  Filled 2014-12-04: qty 1

## 2014-12-04 MED ORDER — HYDROCODONE-ACETAMINOPHEN 5-325 MG PO TABS: 1.0000 | ORAL_TABLET | ORAL | Status: DC | PRN

## 2014-12-04 NOTE — Discharge Instructions (Signed)
Please follow the directions provided. It is very important a follow-up with the dental referral given for further management of this tooth pain. Please take the antibiotics as directed you may take the pain medicine as needed. Be sure not to drive Margo AyeHall taking the Vicodin however because it can make you sleepy. Don't hesitate to return for any new, worsening, or concerning symptoms.  SEEK IMMEDIATE MEDICAL CARE IF:  You have a fever.  You develop redness and swelling of your face, jaw, or neck.  You are unable to open your mouth.  You have severe pain uncontrolled by pain medicine.

## 2014-12-04 NOTE — ED Notes (Addendum)
Pt reports pain in l/lower jaw x 2 days. Treated with Aleve. Pt advised nephrologist , Dr. Carolynn CommentKelly [place] that he needed to be seen. (renal transplant 2009). He "was advised to go to the ED if he could not see a dentist"

## 2014-12-04 NOTE — ED Provider Notes (Signed)
CSN: [id number]     Arrival date & time 12/04/14  1716 History  This chart was scribed for non-physician practitioner working with Mirian MoMatthew Gentry, MD by Richarda Overlieichard Holland, ED Scribe. This patient was seen in room WTR5/WTR5 and the patient's care was started at 6:36 PM.    Chief Complaint  Patient presents with  . Dental Pain    pain in r/lower jaw x 2 days   HPI HPI Comments: [name] is a 53 y.o. male who presents to the Emergency Department complaining of moderate pain in his left lower jaw since yesterday. He rates his pain as a 9/10 at this time. Pt reports similar previous episode years ago. He reports that he already has a few missing teeth and thinks he has food lodged in the area. He says he has tried aleve which failed to relieve his pain. Pt denies nausea, fevers, chills, urinary symptoms.    Past Medical History  Diagnosis Date  . Renal failure   . Diabetes mellitus without complication   . Hypertension   . Burn    Past Surgical History  Procedure Laterality Date  . Kidney transplant Right 2009  . Dialysis fistula creation Right   . Skin graft     Family History  Problem Relation Age of Onset  . Diabetes Mother   . Hypertension Mother   . Diabetes Father    History  Substance Use Topics  . Smoking status: Never Smoker   . Smokeless tobacco: Not on file  . Alcohol Use: Yes     Comment: occ    Review of Systems  HENT: Positive for dental problem.   Gastrointestinal: Negative for nausea and vomiting.  Genitourinary: Negative for dysuria.  All other systems reviewed and are negative.   Allergies  Review of patient's allergies indicates no known allergies.  Home Medications   Prior to Admission medications   Medication Sig Start Date End Date Taking? Authorizing Provider  amLODipine (NORVASC) 10 MG tablet Take 10 mg by mouth daily.    Historical Provider, MD  furosemide (LASIX) 20 MG tablet Take 20 mg by mouth daily.    Historical Provider, MD   metoprolol (LOPRESSOR) 50 MG tablet Take 50 mg by mouth 2 (two) times daily.    Historical Provider, MD  mycophenolate (CELLCEPT) 250 MG capsule Take 750 mg by mouth 2 (two) times daily.    Historical Provider, MD  naproxen sodium (ANAPROX) 220 MG tablet Take 220 mg by mouth 2 (two) times daily as needed (pain).    Historical Provider, MD  omeprazole (PRILOSEC) 20 MG capsule Take 20 mg by mouth daily.    Historical Provider, MD  oxyCODONE-acetaminophen (PERCOCET/ROXICET) 5-325 MG per tablet Take 1 tablet by mouth every 6 (six) hours as needed for severe pain. 05/01/14   Shon Batonourtney F Horton, MD  predniSONE (DELTASONE) 10 MG tablet Take 10 mg by mouth daily with breakfast.    Historical Provider, MD  predniSONE (DELTASONE) 50 MG tablet Take 1 tablet (50 mg total) by mouth daily. 05/01/14   Shon Batonourtney F Horton, MD  tacrolimus (PROGRAF) 1 MG capsule Take 3 mg by mouth 2 (two) times daily.    Historical Provider, MD   BP 141/86 mmHg  Pulse 80  Temp(Src) 97.9 F (36.6 C) (Oral)  Resp 20  Wt 212 lb (96.163 kg)  SpO2 100% Physical Exam  Constitutional: He is oriented to person, place, and time. He appears well-developed and well-nourished.  HENT:  Head: Normocephalic and atraumatic.  Dental carries to the back 2 teeth to the maxillary jaw on his left side.   Eyes: Right eye exhibits no discharge. Left eye exhibits no discharge.  Neck: No tracheal deviation present.  No lymphadenopathy.    Cardiovascular: Normal rate.   Pulmonary/Chest: Effort normal. No respiratory distress.  Abdominal: He exhibits no distension.  Neurological: He is alert and oriented to person, place, and time.  Skin: Skin is warm and dry.  Psychiatric: He has a normal mood and affect. His behavior is normal.  Nursing note and vitals reviewed.   ED Course  Procedures   DIAGNOSTIC STUDIES: Oxygen Saturation is 100% on RA, normal by my interpretation.    COORDINATION OF CARE: 6:39 PM Discussed treatment plan with pt at  bedside and pt agreed to plan.   Labs Review Labs Reviewed - No data to display  Imaging Review No results found.   EKG Interpretation None      MDM   Final diagnoses:  Pain, dental   53 yo with toothache but no gross abscess.  His exam is unconcerning for Ludwig's angina or spread of infection.  Will treat with penicillin and pain medicine.  Resources provided to patient to follow-up with dentist.     I personally performed the services described in this documentation, which was scribed in my presence. The recorded information has been reviewed and is accurate.  Filed Vitals:   12/04/14 1731 12/04/14 1910  BP: 141/86   Pulse: 80 79  Temp: 97.9 F (36.6 C)   TempSrc: Oral   Resp: 20   Weight: 212 lb (96.163 kg)   SpO2: 100% 99%   Meds given in ED:  Medications  naproxen (NAPROSYN) tablet 500 mg (500 mg Oral Given 12/04/14 1909)    Discharge Medication List as of 12/04/2014  6:43 PM    START taking these medications   Details  HYDROcodone-acetaminophen (NORCO/VICODIN) 5-325 MG per tablet Take 1-2 tablets by mouth every 4 (four) hours as needed for moderate pain or severe pain., Starting 12/04/2014, Until Discontinued, Print    penicillin v potassium (VEETID) 500 MG tablet Take 1 tablet (500 mg total) by mouth 4 (four) times daily., Starting 12/04/2014, Until Thu 12/11/14, Print            Harle BattiestElizabeth Chidi Shirer, NP 12/05/14 1718  Mirian MoMatthew Gentry, MD 12/08/14 [phone]

## 2015-01-08 DIAGNOSIS — M5442 Lumbago with sciatica, left side: Secondary | ICD-10-CM | POA: Diagnosis not present

## 2015-01-08 DIAGNOSIS — M4316 Spondylolisthesis, lumbar region: Secondary | ICD-10-CM | POA: Diagnosis not present

## 2015-01-08 DIAGNOSIS — M4306 Spondylolysis, lumbar region: Secondary | ICD-10-CM | POA: Diagnosis not present

## 2015-01-08 DIAGNOSIS — Z6827 Body mass index (BMI) 27.0-27.9, adult: Secondary | ICD-10-CM | POA: Diagnosis not present

## 2015-01-13 DIAGNOSIS — R293 Abnormal posture: Secondary | ICD-10-CM | POA: Diagnosis not present

## 2015-01-13 DIAGNOSIS — M5416 Radiculopathy, lumbar region: Secondary | ICD-10-CM | POA: Diagnosis not present

## 2015-01-13 DIAGNOSIS — R262 Difficulty in walking, not elsewhere classified: Secondary | ICD-10-CM | POA: Diagnosis not present

## 2015-01-13 DIAGNOSIS — M6281 Muscle weakness (generalized): Secondary | ICD-10-CM | POA: Diagnosis not present

## 2015-02-05 DIAGNOSIS — R262 Difficulty in walking, not elsewhere classified: Secondary | ICD-10-CM | POA: Diagnosis not present

## 2015-02-05 DIAGNOSIS — R293 Abnormal posture: Secondary | ICD-10-CM | POA: Diagnosis not present

## 2015-02-05 DIAGNOSIS — M5416 Radiculopathy, lumbar region: Secondary | ICD-10-CM | POA: Diagnosis not present

## 2015-02-05 DIAGNOSIS — M6281 Muscle weakness (generalized): Secondary | ICD-10-CM | POA: Diagnosis not present

## 2015-02-06 DIAGNOSIS — R293 Abnormal posture: Secondary | ICD-10-CM | POA: Diagnosis not present

## 2015-02-06 DIAGNOSIS — M6281 Muscle weakness (generalized): Secondary | ICD-10-CM | POA: Diagnosis not present

## 2015-02-06 DIAGNOSIS — M5416 Radiculopathy, lumbar region: Secondary | ICD-10-CM | POA: Diagnosis not present

## 2015-02-06 DIAGNOSIS — R262 Difficulty in walking, not elsewhere classified: Secondary | ICD-10-CM | POA: Diagnosis not present

## 2015-02-12 DIAGNOSIS — R262 Difficulty in walking, not elsewhere classified: Secondary | ICD-10-CM | POA: Diagnosis not present

## 2015-02-12 DIAGNOSIS — M6281 Muscle weakness (generalized): Secondary | ICD-10-CM | POA: Diagnosis not present

## 2015-02-12 DIAGNOSIS — M5416 Radiculopathy, lumbar region: Secondary | ICD-10-CM | POA: Diagnosis not present

## 2015-02-12 DIAGNOSIS — R293 Abnormal posture: Secondary | ICD-10-CM | POA: Diagnosis not present

## 2015-02-17 DIAGNOSIS — M6281 Muscle weakness (generalized): Secondary | ICD-10-CM | POA: Diagnosis not present

## 2015-02-17 DIAGNOSIS — M5416 Radiculopathy, lumbar region: Secondary | ICD-10-CM | POA: Diagnosis not present

## 2015-02-17 DIAGNOSIS — R262 Difficulty in walking, not elsewhere classified: Secondary | ICD-10-CM | POA: Diagnosis not present

## 2015-02-17 DIAGNOSIS — R293 Abnormal posture: Secondary | ICD-10-CM | POA: Diagnosis not present

## 2015-02-19 DIAGNOSIS — R293 Abnormal posture: Secondary | ICD-10-CM | POA: Diagnosis not present

## 2015-02-19 DIAGNOSIS — M5416 Radiculopathy, lumbar region: Secondary | ICD-10-CM | POA: Diagnosis not present

## 2015-02-19 DIAGNOSIS — R262 Difficulty in walking, not elsewhere classified: Secondary | ICD-10-CM | POA: Diagnosis not present

## 2015-02-19 DIAGNOSIS — M6281 Muscle weakness (generalized): Secondary | ICD-10-CM | POA: Diagnosis not present

## 2015-02-23 DIAGNOSIS — M5416 Radiculopathy, lumbar region: Secondary | ICD-10-CM | POA: Diagnosis not present

## 2015-02-23 DIAGNOSIS — R262 Difficulty in walking, not elsewhere classified: Secondary | ICD-10-CM | POA: Diagnosis not present

## 2015-02-23 DIAGNOSIS — R293 Abnormal posture: Secondary | ICD-10-CM | POA: Diagnosis not present

## 2015-02-23 DIAGNOSIS — M6281 Muscle weakness (generalized): Secondary | ICD-10-CM | POA: Diagnosis not present

## 2015-02-26 DIAGNOSIS — R293 Abnormal posture: Secondary | ICD-10-CM | POA: Diagnosis not present

## 2015-02-26 DIAGNOSIS — M5416 Radiculopathy, lumbar region: Secondary | ICD-10-CM | POA: Diagnosis not present

## 2015-02-26 DIAGNOSIS — M6281 Muscle weakness (generalized): Secondary | ICD-10-CM | POA: Diagnosis not present

## 2015-02-26 DIAGNOSIS — R262 Difficulty in walking, not elsewhere classified: Secondary | ICD-10-CM | POA: Diagnosis not present

## 2015-03-05 DIAGNOSIS — R262 Difficulty in walking, not elsewhere classified: Secondary | ICD-10-CM | POA: Diagnosis not present

## 2015-03-05 DIAGNOSIS — M5416 Radiculopathy, lumbar region: Secondary | ICD-10-CM | POA: Diagnosis not present

## 2015-03-05 DIAGNOSIS — M6281 Muscle weakness (generalized): Secondary | ICD-10-CM | POA: Diagnosis not present

## 2015-03-05 DIAGNOSIS — R293 Abnormal posture: Secondary | ICD-10-CM | POA: Diagnosis not present

## 2015-03-16 DIAGNOSIS — M5416 Radiculopathy, lumbar region: Secondary | ICD-10-CM | POA: Diagnosis not present

## 2015-03-16 DIAGNOSIS — R293 Abnormal posture: Secondary | ICD-10-CM | POA: Diagnosis not present

## 2015-03-16 DIAGNOSIS — M6281 Muscle weakness (generalized): Secondary | ICD-10-CM | POA: Diagnosis not present

## 2015-03-16 DIAGNOSIS — R262 Difficulty in walking, not elsewhere classified: Secondary | ICD-10-CM | POA: Diagnosis not present

## 2015-03-19 DIAGNOSIS — M5416 Radiculopathy, lumbar region: Secondary | ICD-10-CM | POA: Diagnosis not present

## 2015-03-19 DIAGNOSIS — R293 Abnormal posture: Secondary | ICD-10-CM | POA: Diagnosis not present

## 2015-03-19 DIAGNOSIS — M6281 Muscle weakness (generalized): Secondary | ICD-10-CM | POA: Diagnosis not present

## 2015-03-19 DIAGNOSIS — R262 Difficulty in walking, not elsewhere classified: Secondary | ICD-10-CM | POA: Diagnosis not present

## 2015-03-25 DIAGNOSIS — R262 Difficulty in walking, not elsewhere classified: Secondary | ICD-10-CM | POA: Diagnosis not present

## 2015-03-25 DIAGNOSIS — M5416 Radiculopathy, lumbar region: Secondary | ICD-10-CM | POA: Diagnosis not present

## 2015-03-25 DIAGNOSIS — M6281 Muscle weakness (generalized): Secondary | ICD-10-CM | POA: Diagnosis not present

## 2015-03-25 DIAGNOSIS — R293 Abnormal posture: Secondary | ICD-10-CM | POA: Diagnosis not present

## 2015-04-24 DIAGNOSIS — E785 Hyperlipidemia, unspecified: Secondary | ICD-10-CM | POA: Diagnosis not present

## 2015-04-24 DIAGNOSIS — Z8639 Personal history of other endocrine, nutritional and metabolic disease: Secondary | ICD-10-CM | POA: Diagnosis not present

## 2015-04-24 DIAGNOSIS — Z940 Kidney transplant status: Secondary | ICD-10-CM | POA: Diagnosis not present

## 2015-04-24 DIAGNOSIS — D899 Disorder involving the immune mechanism, unspecified: Secondary | ICD-10-CM | POA: Diagnosis not present

## 2015-04-24 DIAGNOSIS — I129 Hypertensive chronic kidney disease with stage 1 through stage 4 chronic kidney disease, or unspecified chronic kidney disease: Secondary | ICD-10-CM | POA: Diagnosis not present

## 2015-05-19 DIAGNOSIS — H9313 Tinnitus, bilateral: Secondary | ICD-10-CM | POA: Diagnosis not present

## 2015-08-20 DIAGNOSIS — Z940 Kidney transplant status: Secondary | ICD-10-CM | POA: Diagnosis not present

## 2015-08-20 DIAGNOSIS — D899 Disorder involving the immune mechanism, unspecified: Secondary | ICD-10-CM | POA: Diagnosis not present

## 2015-08-20 DIAGNOSIS — I129 Hypertensive chronic kidney disease with stage 1 through stage 4 chronic kidney disease, or unspecified chronic kidney disease: Secondary | ICD-10-CM | POA: Diagnosis not present

## 2015-08-20 DIAGNOSIS — E785 Hyperlipidemia, unspecified: Secondary | ICD-10-CM | POA: Diagnosis not present

## 2015-08-20 DIAGNOSIS — Z8639 Personal history of other endocrine, nutritional and metabolic disease: Secondary | ICD-10-CM | POA: Diagnosis not present

## 2015-08-21 ENCOUNTER — Encounter (HOSPITAL_COMMUNITY): Payer: Self-pay | Admitting: Emergency Medicine

## 2015-08-21 ENCOUNTER — Emergency Department (HOSPITAL_COMMUNITY)
Admission: EM | Admit: 2015-08-21 | Discharge: 2015-08-21 | Disposition: A | Discharge status: Home / Self Care | Payer: Medicare Other | Attending: Emergency Medicine | Admitting: Emergency Medicine

## 2015-08-21 DIAGNOSIS — M545 Low back pain: Secondary | ICD-10-CM | POA: Insufficient documentation

## 2015-08-21 DIAGNOSIS — I120 Hypertensive chronic kidney disease with stage 5 chronic kidney disease or end stage renal disease: Secondary | ICD-10-CM | POA: Diagnosis not present

## 2015-08-21 DIAGNOSIS — Z940 Kidney transplant status: Secondary | ICD-10-CM | POA: Insufficient documentation

## 2015-08-21 DIAGNOSIS — E119 Type 2 diabetes mellitus without complications: Secondary | ICD-10-CM | POA: Insufficient documentation

## 2015-08-21 DIAGNOSIS — M79604 Pain in right leg: Secondary | ICD-10-CM | POA: Diagnosis present

## 2015-08-21 DIAGNOSIS — Z992 Dependence on renal dialysis: Secondary | ICD-10-CM | POA: Diagnosis not present

## 2015-08-21 DIAGNOSIS — M5430 Sciatica, unspecified side: Secondary | ICD-10-CM | POA: Insufficient documentation

## 2015-08-21 DIAGNOSIS — Z79899 Other long term (current) drug therapy: Secondary | ICD-10-CM | POA: Insufficient documentation

## 2015-08-21 DIAGNOSIS — N186 End stage renal disease: Secondary | ICD-10-CM | POA: Diagnosis not present

## 2015-08-21 DIAGNOSIS — Z7952 Long term (current) use of systemic steroids: Secondary | ICD-10-CM | POA: Diagnosis not present

## 2015-08-21 DIAGNOSIS — G8929 Other chronic pain: Secondary | ICD-10-CM | POA: Diagnosis not present

## 2015-08-21 MED ORDER — HYDROCODONE-ACETAMINOPHEN 5-325 MG PO TABS: 1.0000 | ORAL_TABLET | Freq: Four times a day (QID) | ORAL | Status: DC | PRN

## 2015-08-21 NOTE — Discharge Instructions (Signed)
Your pain has been ongoing for more than 3 months. The emergency department does not manage chronic pain complaints. Follow-up with your doctor for any further evaluation of your symptoms. Try alternating ice and heat to your low back and limiting your heavy lifting. You may take ibuprofen or Aleve as instructed by your primary care doctor. Follow-up with your primary doctor for any additional pain control.  Sciatica Sciatica is pain, weakness, numbness, or tingling along the path of the sciatic nerve. The nerve starts in the lower back and runs down the back of each leg. The nerve controls the muscles in the lower leg and in the back of the knee, while also providing sensation to the back of the thigh, lower leg, and the sole of your foot. Sciatica is a symptom of another medical condition. For instance, nerve damage or certain conditions, such as a herniated disk or bone spur on the spine, pinch or put pressure on the sciatic nerve. This causes the pain, weakness, or other sensations normally associated with sciatica. Generally, sciatica only affects one side of the body. CAUSES   Herniated or slipped disc.  Degenerative disk disease.  A pain disorder involving the narrow muscle in the buttocks (piriformis syndrome).  Pelvic injury or fracture.  Pregnancy.  Tumor (rare). SYMPTOMS  Symptoms can vary from mild to very severe. The symptoms usually travel from the low back to the buttocks and down the back of the leg. Symptoms can include:  Mild tingling or dull aches in the lower back, leg, or hip.  Numbness in the back of the calf or sole of the foot.  Burning sensations in the lower back, leg, or hip.  Sharp pains in the lower back, leg, or hip.  Leg weakness.  Severe back pain inhibiting movement. These symptoms may get worse with coughing, sneezing, laughing, or prolonged sitting or standing. Also, being overweight may worsen symptoms. DIAGNOSIS  Your caregiver will perform a  physical exam to look for common symptoms of sciatica. He or she may ask you to do certain movements or activities that would trigger sciatic nerve pain. Other tests may be performed to find the cause of the sciatica. These may include:  Blood tests.  X-rays.  Imaging tests, such as an MRI or CT scan. TREATMENT  Treatment is directed at the cause of the sciatic pain. Sometimes, treatment is not necessary and the pain and discomfort goes away on its own. If treatment is needed, your caregiver may suggest:  Over-the-counter medicines to relieve pain.  Prescription medicines, such as anti-inflammatory medicine, muscle relaxants, or narcotics.  Applying heat or ice to the painful area.  Steroid injections to lessen pain, irritation, and inflammation around the nerve.  Reducing activity during periods of pain.  Exercising and stretching to strengthen your abdomen and improve flexibility of your spine. Your caregiver may suggest losing weight if the extra weight makes the back pain worse.  Physical therapy.  Surgery to eliminate what is pressing or pinching the nerve, such as a bone spur or part of a herniated disk. HOME CARE INSTRUCTIONS   Only take over-the-counter or prescription medicines for pain or discomfort as directed by your caregiver.  Apply ice to the affected area for 20 minutes, 3-4 times a day for the first 48-72 hours. Then try heat in the same way.  Exercise, stretch, or perform your usual activities if these do not aggravate your pain.  Attend physical therapy sessions as directed by your caregiver.  Keep all  follow-up appointments as directed by your caregiver.  Do not wear high heels or shoes that do not provide proper support.  Check your mattress to see if it is too soft. A firm mattress may lessen your pain and discomfort. SEEK IMMEDIATE MEDICAL CARE IF:   You lose control of your bowel or bladder (incontinence).  You have increasing weakness in the lower  back, pelvis, buttocks, or legs.  You have redness or swelling of your back.  You have a burning sensation when you urinate.  You have pain that gets worse when you lie down or awakens you at night.  Your pain is worse than you have experienced in the past.  Your pain is lasting longer than 4 weeks.  You are suddenly losing weight without reason. MAKE SURE YOU:  Understand these instructions.  Will watch your condition.  Will get help right away if you are not doing well or get worse. Document Released: 12/06/2001 Document Revised: 06/12/2012 Document Reviewed: 04/22/2012 [facility] Patient Information 2015 [place], [place]. This information is not intended to replace advice given to you by your health care provider. Make sure you discuss any questions you have with your health care provider.

## 2015-08-21 NOTE — ED Provider Notes (Signed)
CSN: [id number]     Arrival date & time 08/21/15  1814 History   This chart was scribed for non-physician practitioner, Antony Madura, PA-C working with Mancel Bale, MD by Freida Busman, ED Scribe. This patient was seen in room WTR7/WTR7 and the patient's care was started at 8:07 PM.    Chief Complaint  Patient presents with  . Leg Pain    The history is provided by the patient. No language interpreter was used.     HPI Comments:  [name] is a 54 y.o. male who presents to the Emergency Department complaining of persistent pain to his BLE. Pt notes the pain starts in his glutes and radiates down the back of his lower extremities. He also notes a burning sensation to the top of his right foot. Pt has been experiencing this same pain for ~4 months with atraumatic onset. He states he was told by his PCP his pain may be due to nerve damage. Pt went to PT after initial onset which improved his pain but  has stopped going; states he was given at home exercises to continue. Pt saw PCP 2 days ago, who advised pt to take Advil. He reports taking 1 advil today with moderate relief but states he would like stronger medication for his pain. Pt admits to heavy lifting at work. He denies involuntary bowel/bladder incontinence and acute injury.    Past Medical History  Diagnosis Date  . Renal failure   . Diabetes mellitus without complication   . Hypertension   . Burn    Past Surgical History  Procedure Laterality Date  . Kidney transplant Right 2009  . Dialysis fistula creation Right   . Skin graft     Family History  Problem Relation Age of Onset  . Diabetes Mother   . Hypertension Mother   . Diabetes Father    Social History  Substance Use Topics  . Smoking status: Never Smoker   . Smokeless tobacco: None  . Alcohol Use: Yes     Comment: occ    Review of Systems  Constitutional: Negative for fever and chills.  Musculoskeletal: Positive for myalgias and back pain.  All other  systems reviewed and are negative.   Allergies  Review of patient's allergies indicates no known allergies.  Home Medications   Prior to Admission medications   Medication Sig Start Date End Date Taking? Authorizing Provider  amLODipine (NORVASC) 10 MG tablet Take 10 mg by mouth daily.   Yes Historical Provider, MD  furosemide (LASIX) 20 MG tablet Take 20 mg by mouth daily.   Yes Historical Provider, MD  metoprolol (LOPRESSOR) 50 MG tablet Take 50 mg by mouth 2 (two) times daily.   Yes Historical Provider, MD  mycophenolate (CELLCEPT) 250 MG capsule Take 750 mg by mouth 2 (two) times daily.   Yes Historical Provider, MD  naproxen sodium (ANAPROX) 220 MG tablet Take 220 mg by mouth 2 (two) times daily as needed (pain).   Yes Historical Provider, MD  omeprazole (PRILOSEC) 20 MG capsule Take 20 mg by mouth daily.   Yes Historical Provider, MD  predniSONE (DELTASONE) 10 MG tablet Take 10 mg by mouth daily with breakfast.   Yes Historical Provider, MD  tacrolimus (PROGRAF) 1 MG capsule Take 3 mg by mouth 2 (two) times daily.   Yes Historical Provider, MD  HYDROcodone-acetaminophen (NORCO/VICODIN) 5-325 MG per tablet Take 1-2 tablets by mouth every 6 (six) hours as needed for moderate pain or severe pain. 08/21/15  Antony Madura, PA-C  predniSONE (DELTASONE) 50 MG tablet Take 1 tablet (50 mg total) by mouth daily. Patient not taking: Reported on 08/21/2015 05/01/14   Shon Baton, MD   BP 149/86 mmHg  Pulse 70  Temp(Src) 98.5 F (36.9 C) (Oral)  Resp 15  SpO2 98%   Physical Exam  Constitutional: He is oriented to person, place, and time. He appears well-developed and well-nourished. No distress.  HENT:  Head: Normocephalic and atraumatic.  Eyes: Conjunctivae and EOM are normal. No scleral icterus.  Neck: Normal range of motion.  Cardiovascular: Normal rate, regular rhythm and intact distal pulses.   DP and PT pulses 2+ bilaterally  Pulmonary/Chest: Effort normal. No respiratory  distress.  Musculoskeletal: Normal range of motion. He exhibits tenderness.  Tenderness to bilateral lumbar paraspinal muscles. No tenderness to palpation to the lumbar midline. No bony deformities, step-offs, or crepitus.  Neurological: He is alert and oriented to person, place, and time. He exhibits normal muscle tone. Coordination normal.  GCS 15. Sensation to light touch intact in the bilateral lower extremities. Patient ambulatory with steady gait.  Skin: Skin is warm and dry. No rash noted. He is not diaphoretic. No erythema. No pallor.  Psychiatric: He has a normal mood and affect. His behavior is normal.  Nursing note and vitals reviewed.   ED Course  Procedures   DIAGNOSTIC STUDIES:  Oxygen Saturation is 98% on RA, normal by my interpretation.    COORDINATION OF CARE:  8:15 PM Discussed treatment plan with pt at bedside and pt agreed to plan.  Labs Review Labs Reviewed - No data to display  Imaging Review No results found.   I have personally reviewed and evaluated these images and lab results as part of my medical decision-making.   EKG Interpretation None      MDM   Final diagnoses:  Chronic sciatica, unspecified laterality    54 year old male presents to the emergency department for evaluation of bilateral lower extremity pain. Symptoms have been ongoing over the past 4 months. Patient reports improvement after ibuprofen and multiple visits to physical therapy. He has been followed by his primary care doctor for the pain, but states that his primary care doctor does not wish to prescribe stronger medications than ibuprofen. Patient is neurovascularly intact today. He is afebrile. No red flags or signs concerning for cauda equina. Symptoms consistent with chronic sciatica. Have advised the patient to follow-up with his primary care doctor for further management of his symptoms. Have explained to the patient that the Emergency Department is not the appropriate venue  for management of chronic pain. Return precautions given at discharge. Patient discharged in good condition with no unaddressed concerns.  I personally performed the services described in this documentation, which was scribed in my presence. The recorded information has been reviewed and is accurate.   Filed Vitals:   08/21/15 1836  BP: 149/86  Pulse: 70  Temp: 98.5 F (36.9 C)  TempSrc: Oral  Resp: 15  SpO2: 98%      Antony Madura, PA-C 08/21/15 2115  Mancel Bale, MD 08/21/15 [phone]

## 2015-08-21 NOTE — ED Notes (Signed)
Pt c/o bilat leg pain that starts at his gluteus and goes down. Pt states that it is effecting him beinig able to drive due to the pain. Pt states that he was seen here several months ago for the same thing.

## 2015-09-16 ENCOUNTER — Emergency Department (HOSPITAL_COMMUNITY): Payer: Medicare Other

## 2015-09-16 ENCOUNTER — Ambulatory Visit (HOSPITAL_COMMUNITY)
Admission: RE | Admit: 2015-09-16 | Discharge: 2015-09-16 | Disposition: A | Discharge status: Home / Self Care | Payer: Medicare Other | Source: Home / Self Care | Attending: Psychiatry | Admitting: Psychiatry

## 2015-09-16 ENCOUNTER — Encounter (HOSPITAL_COMMUNITY): Payer: Self-pay | Admitting: *Deleted

## 2015-09-16 ENCOUNTER — Emergency Department (HOSPITAL_COMMUNITY)
Admission: EM | Admit: 2015-09-16 | Discharge: 2015-09-17 | Payer: Medicare Other | Attending: Emergency Medicine | Admitting: Emergency Medicine

## 2015-09-16 DIAGNOSIS — E119 Type 2 diabetes mellitus without complications: Secondary | ICD-10-CM | POA: Diagnosis not present

## 2015-09-16 DIAGNOSIS — Z940 Kidney transplant status: Secondary | ICD-10-CM | POA: Insufficient documentation

## 2015-09-16 DIAGNOSIS — Z79899 Other long term (current) drug therapy: Secondary | ICD-10-CM | POA: Insufficient documentation

## 2015-09-16 DIAGNOSIS — Z7952 Long term (current) use of systemic steroids: Secondary | ICD-10-CM | POA: Insufficient documentation

## 2015-09-16 DIAGNOSIS — R440 Auditory hallucinations: Secondary | ICD-10-CM | POA: Insufficient documentation

## 2015-09-16 DIAGNOSIS — R443 Hallucinations, unspecified: Secondary | ICD-10-CM | POA: Diagnosis not present

## 2015-09-16 DIAGNOSIS — Z87448 Personal history of other diseases of urinary system: Secondary | ICD-10-CM | POA: Insufficient documentation

## 2015-09-16 DIAGNOSIS — I10 Essential (primary) hypertension: Secondary | ICD-10-CM | POA: Insufficient documentation

## 2015-09-16 LAB — CBC
HEMATOCRIT: 44.2 % (ref 39.0–52.0)
Hemoglobin: 15.3 g/dL (ref 13.0–17.0)
MCH: 30 pg (ref 26.0–34.0)
MCHC: 34.6 g/dL (ref 30.0–36.0)
MCV: 86.7 fL (ref 78.0–100.0)
Platelets: 312 10*3/uL (ref 150–400)
RBC: 5.1 MIL/uL (ref 4.22–5.81)
RDW: 13.7 % (ref 11.5–15.5)
WBC: 12.8 10*3/uL — AB (ref 4.0–10.5)

## 2015-09-16 LAB — COMPREHENSIVE METABOLIC PANEL
ALBUMIN: 4.7 g/dL (ref 3.5–5.0)
ALT: 15 U/L — ABNORMAL LOW (ref 17–63)
AST: 21 U/L (ref 15–41)
Alkaline Phosphatase: 48 U/L (ref 38–126)
Anion gap: 7 (ref 5–15)
BILIRUBIN TOTAL: 1.3 mg/dL — AB (ref 0.3–1.2)
BUN: 14 mg/dL (ref 6–20)
CHLORIDE: 102 mmol/L (ref 101–111)
CO2: 29 mmol/L (ref 22–32)
Calcium: 9.7 mg/dL (ref 8.9–10.3)
Creatinine, Ser: 1.14 mg/dL (ref 0.61–1.24)
GFR calc Af Amer: >60 mL/min (ref >60)
GFR calc non Af Amer: >60 mL/min (ref >60)
GLUCOSE: 103 mg/dL — AB (ref 65–99)
POTASSIUM: 3.3 mmol/L — AB (ref 3.5–5.1)
Sodium: 138 mmol/L (ref 135–145)
Total Protein: 8 g/dL (ref 6.5–8.1)

## 2015-09-16 LAB — RAPID URINE DRUG SCREEN, HOSP PERFORMED
AMPHETAMINES: NOT DETECTED
BARBITURATES: NOT DETECTED
BENZODIAZEPINES: NOT DETECTED
Cocaine: NOT DETECTED
Opiates: NOT DETECTED
Tetrahydrocannabinol: NOT DETECTED

## 2015-09-16 LAB — ACETAMINOPHEN LEVEL: Acetaminophen (Tylenol), Serum: <10 ug/mL — ABNORMAL LOW (ref 10–30)

## 2015-09-16 LAB — ETHANOL: Alcohol, Ethyl (B): <5 mg/dL (ref <5)

## 2015-09-16 LAB — SALICYLATE LEVEL: Salicylate Lvl: <4 mg/dL (ref 2.8–30.0)

## 2015-09-16 MED ORDER — NICOTINE 21 MG/24HR TD PT24
21.0000 mg | MEDICATED_PATCH | Freq: Every day | TRANSDERMAL | Status: DC
  Filled 2015-09-16: qty 1

## 2015-09-16 MED ORDER — TACROLIMUS 1 MG PO CAPS
3.0000 mg | ORAL_CAPSULE | Freq: Two times a day (BID) | ORAL | Status: DC
  Administered 2015-09-16 – 2015-09-17 (×2): 3 mg via ORAL
  Filled 2015-09-16 (×3): qty 3

## 2015-09-16 MED ORDER — ZOLPIDEM TARTRATE 5 MG PO TABS
5.0000 mg | ORAL_TABLET | Freq: Every evening | ORAL | Status: DC | PRN
  Administered 2015-09-16: 5 mg via ORAL
  Filled 2015-09-16: qty 1

## 2015-09-16 MED ORDER — ONDANSETRON HCL 4 MG PO TABS: 4.0000 mg | ORAL_TABLET | Freq: Three times a day (TID) | ORAL | Status: DC | PRN

## 2015-09-16 MED ORDER — PANTOPRAZOLE SODIUM 40 MG PO TBEC
40.0000 mg | DELAYED_RELEASE_TABLET | Freq: Every day | ORAL | Status: DC
  Administered 2015-09-16 – 2015-09-17 (×2): 40 mg via ORAL
  Filled 2015-09-16 (×2): qty 1

## 2015-09-16 MED ORDER — LORAZEPAM 2 MG/ML IJ SOLN: 0.0000 mg | Freq: Two times a day (BID) | INTRAMUSCULAR | Status: DC

## 2015-09-16 MED ORDER — LORAZEPAM 2 MG/ML IJ SOLN: 0.0000 mg | Freq: Four times a day (QID) | INTRAMUSCULAR | Status: DC

## 2015-09-16 MED ORDER — METOPROLOL TARTRATE 25 MG PO TABS
50.0000 mg | ORAL_TABLET | Freq: Two times a day (BID) | ORAL | Status: DC
  Administered 2015-09-16 – 2015-09-17 (×2): 50 mg via ORAL
  Filled 2015-09-16 (×2): qty 2

## 2015-09-16 MED ORDER — PREDNISONE 5 MG PO TABS
5.0000 mg | ORAL_TABLET | Freq: Every day | ORAL | Status: DC
  Administered 2015-09-17: 5 mg via ORAL
  Filled 2015-09-16: qty 1

## 2015-09-16 MED ORDER — LORAZEPAM 1 MG PO TABS: 0.0000 mg | ORAL_TABLET | Freq: Two times a day (BID) | ORAL | Status: DC

## 2015-09-16 MED ORDER — LORAZEPAM 1 MG PO TABS: 0.0000 mg | ORAL_TABLET | Freq: Four times a day (QID) | ORAL | Status: DC

## 2015-09-16 MED ORDER — ACETAMINOPHEN 325 MG PO TABS: 650.0000 mg | ORAL_TABLET | ORAL | Status: DC | PRN

## 2015-09-16 MED ORDER — MYCOPHENOLATE MOFETIL 250 MG PO CAPS
750.0000 mg | ORAL_CAPSULE | Freq: Two times a day (BID) | ORAL | Status: DC
  Administered 2015-09-16 – 2015-09-17 (×2): 750 mg via ORAL
  Filled 2015-09-16 (×3): qty 3

## 2015-09-16 MED ORDER — AMLODIPINE BESYLATE 10 MG PO TABS
10.0000 mg | ORAL_TABLET | Freq: Every day | ORAL | Status: DC
  Administered 2015-09-17: 10 mg via ORAL
  Filled 2015-09-16: qty 1

## 2015-09-16 MED ORDER — POTASSIUM CHLORIDE CRYS ER 20 MEQ PO TBCR
40.0000 meq | EXTENDED_RELEASE_TABLET | Freq: Once | ORAL | Status: AC
  Administered 2015-09-16: 40 meq via ORAL
  Filled 2015-09-16: qty 2

## 2015-09-16 MED ORDER — FUROSEMIDE 40 MG PO TABS
20.0000 mg | ORAL_TABLET | Freq: Every day | ORAL | Status: DC
  Administered 2015-09-17: 20 mg via ORAL
  Filled 2015-09-16: qty 1

## 2015-09-16 NOTE — ED Notes (Addendum)
Pt was a walk in to [facility], no bed at [facility] right now. Pt has been paranoid and hearing voices. No psych hx.    Upon rn assessment, pt reports hearing voices x5 months. Does smoke marijuana and drinks ETOH every other day. Denies SI/HI. Repots 1-2x seeing black shadows". Reports chronic leg pain 4/10.

## 2015-09-16 NOTE — ED Notes (Signed)
Pt presents with complaint of auditory and visual hallucination x 5 mos.  Denies SI or HI.  Denies feeling hopeless.  Pt reports he is a Kidney Transplant recipient.  AAO x 3, no distress noted, calm, cooperative, interactive.  Monitoring for safety, Q 15 min checks in effect.

## 2015-09-16 NOTE — ED Notes (Signed)
Pt currently in the restroom will collect labs when pt returns to his room.

## 2015-09-16 NOTE — ED Provider Notes (Signed)
CSN: [id number]     Arrival date & time 09/16/15  1848 History   First MD Initiated Contact with Patient 09/16/15 1909     Chief Complaint  Patient presents with  . Hallucinations  . Paranoid     (Consider location/radiation/quality/duration/timing/severity/associated sxs/prior Treatment) HPI Comments: History of kidney transplant. He feels like there something that is mad he has a donor kidney. He has noted some feelings of anal penetration when masturbating that persisted even after he stopped masturbating. He states they have improved since he has put toilet paper in his anus. He thinks this could be someone or something that is mad he has someone else's kidney  Patient is a 54 y.o. male presenting with mental health disorder. The history is provided by the patient.  Mental Health Problem Presenting symptoms: hallucinations (patient is hearing voices that are saying, "you know." Voices are not telling him to hurt himself or others)   Degree of incapacity (severity):  Moderate Onset quality:  Gradual Duration:  5 months Timing:  Constant Progression:  Unchanged Chronicity:  Chronic Context: not noncompliant and not recent medication change   Treatment compliance:  Untreated Relieved by:  Nothing Worsened by:  Nothing tried   Past Medical History  Diagnosis Date  . Renal failure   . Diabetes mellitus without complication   . Hypertension   . Burn    Past Surgical History  Procedure Laterality Date  . Kidney transplant Right 2009  . Dialysis fistula creation Right   . Skin graft     Family History  Problem Relation Age of Onset  . Diabetes Mother   . Hypertension Mother   . Diabetes Father    Social History  Substance Use Topics  . Smoking status: Never Smoker   . Smokeless tobacco: None  . Alcohol Use: Yes     Comment: occ    Review of Systems  Constitutional: Negative for fever.  Respiratory: Negative for cough and shortness of breath.    Psychiatric/Behavioral: Positive for hallucinations (patient is hearing voices that are saying, "you know." Voices are not telling him to hurt himself or others).  All other systems reviewed and are negative.     Allergies  Review of patient's allergies indicates no known allergies.  Home Medications   Prior to Admission medications   Medication Sig Start Date End Date Taking? Authorizing Provider  amLODipine (NORVASC) 10 MG tablet Take 10 mg by mouth daily.    Historical Provider, MD  furosemide (LASIX) 20 MG tablet Take 20 mg by mouth daily.    Historical Provider, MD  HYDROcodone-acetaminophen (NORCO/VICODIN) 5-325 MG per tablet Take 1-2 tablets by mouth every 6 (six) hours as needed for moderate pain or severe pain. 08/21/15   Antony Madura, PA-C  metoprolol (LOPRESSOR) 50 MG tablet Take 50 mg by mouth 2 (two) times daily.    Historical Provider, MD  mycophenolate (CELLCEPT) 250 MG capsule Take 750 mg by mouth 2 (two) times daily.    Historical Provider, MD  naproxen sodium (ANAPROX) 220 MG tablet Take 220 mg by mouth 2 (two) times daily as needed (pain).    Historical Provider, MD  omeprazole (PRILOSEC) 20 MG capsule Take 20 mg by mouth daily.    Historical Provider, MD  predniSONE (DELTASONE) 10 MG tablet Take 10 mg by mouth daily with breakfast.    Historical Provider, MD  predniSONE (DELTASONE) 50 MG tablet Take 1 tablet (50 mg total) by mouth daily. Patient not taking: Reported on 08/21/2015 05/01/14  Shon Baton, MD  tacrolimus (PROGRAF) 1 MG capsule Take 3 mg by mouth 2 (two) times daily.    Historical Provider, MD   BP 143/81 mmHg  Pulse 66  Temp(Src) 97.8 F (36.6 C)  Resp 16  SpO2 100% Physical Exam  Constitutional: He is oriented to person, place, and time. He appears well-developed and well-nourished. No distress.  HENT:  Head: Normocephalic and atraumatic.  Mouth/Throat: No oropharyngeal exudate.  Eyes: EOM are normal. Pupils are equal, round, and reactive to  light.  Neck: Normal range of motion. Neck supple.  Cardiovascular: Normal rate and regular rhythm.  Exam reveals no friction rub.   No murmur heard. Pulmonary/Chest: Effort normal and breath sounds normal. No respiratory distress. He has no wheezes. He has no rales.  Abdominal: He exhibits no distension. There is no tenderness. There is no rebound.  Musculoskeletal: Normal range of motion. He exhibits no edema.  Neurological: He is alert and oriented to person, place, and time.  Skin: He is not diaphoretic.  Nursing note and vitals reviewed.   ED Course  Procedures (including critical care time) Labs Review Labs Reviewed  CBC - Abnormal; Notable for the following:    WBC 12.8 (*)    All other components within normal limits  COMPREHENSIVE METABOLIC PANEL  ETHANOL  SALICYLATE LEVEL  ACETAMINOPHEN LEVEL  URINE RAPID DRUG SCREEN, HOSP PERFORMED    Imaging Review Ct Head Wo Contrast  09/16/2015   CLINICAL DATA:  Hallucinations.  EXAM: CT HEAD WITHOUT CONTRAST  TECHNIQUE: Contiguous axial images were obtained from the base of the skull through the vertex without intravenous contrast.  COMPARISON:  None.  FINDINGS: Bony calvarium appears intact. No mass effect or midline shift is noted. Ventricular size is within normal limits. There is no evidence of mass lesion, hemorrhage or acute infarction.  IMPRESSION: Normal head CT.   Electronically Signed   By: Lupita Raider, M.D.   On: 09/16/2015 20:47   I have personally reviewed and evaluated these images and lab results as part of my medical decision-making.   EKG Interpretation None      MDM   Final diagnoses:  Auditory hallucination    Patient here for medical clearance. Seen by psych consider, but there are no beds available. Will screen with labs. CT ok, labs ok. TTS consulted.    Elwin Mocha, MD 09/16/15 2322

## 2015-09-16 NOTE — ED Notes (Signed)
Pt wanded by security and walked back to [facility]

## 2015-09-16 NOTE — BH Assessment (Signed)
Assessment Note  [name] is an 54 y.o. African American  male that is a walk in at [facility]. Patient denies SI/HI/Substance Abuse.  Patient reports hearing voices for the past 4 months. Patient reports that he feels as if someone is following him because when he gets to his destination he begins to hear the voice. Patient denies a prior history of psychiatric hospitalization or any other outpatient mental health treatment. Patient reports that he has never heard any voices that no one else can hear in his entire life. During the assessment the patient seemed to be responding to internal stimuli as evidenced by the patient asking me if I could hear the high pitched sound.   Patient reports that he lives with his brother and was divorced in 2008.  Patient reports that he has two grown sons 56 and 68 years old.  Patient reports that he received a new kidney in 2009.  Patient denies any traumatic events in his life.  Patient denies physical sexual or emotional abuse.    Axis I: Mood Disorder NOS Axis II: Deferred Axis III:  Past Medical History  Diagnosis Date  . Renal failure   . Diabetes mellitus without complication   . Hypertension   . Burn    Axis IV: other psychosocial or environmental problems, problems related to social environment and problems with access to health care services Axis V: 31-40 impairment in reality testing  Past Medical History:  Past Medical History  Diagnosis Date  . Renal failure   . Diabetes mellitus without complication   . Hypertension   . Burn     Past Surgical History  Procedure Laterality Date  . Kidney transplant Right 2009  . Dialysis fistula creation Right   . Skin graft      Family History:  Family History  Problem Relation Age of Onset  . Diabetes Mother   . Hypertension Mother   . Diabetes Father     Social History:  reports that he has never smoked. He does not have any smokeless tobacco history on file. He reports that he  drinks alcohol. He reports that he does not use illicit drugs.  Additional Social History:  Alcohol / Drug Use History of alcohol / drug use?: No history of alcohol / drug abuse  CIWA:   COWS:    Allergies: No Known Allergies  Home Medications:  (Not in a hospital admission)  OB/GYN Status:  No LMP for male patient.  General Assessment Data Location of Assessment:  (Walk In at [facility]) TTS Assessment: In system Is this a Tele or Face-to-Face Assessment?: Face-to-Face Is this an Initial Assessment or a Re-assessment for this encounter?: Initial Assessment Marital status: Single Maiden name: NA Is patient pregnant?: No Pregnancy Status: No Living Arrangements:  (Grandmother ) Can pt return to current living arrangement?: Yes Admission Status: Voluntary Is patient capable of signing voluntary admission?: Yes Referral Source: Self/Family/Friend Insurance type: Medicare  Medical Screening Exam [facility] Walk-in ONLY) Medical Exam completed: Yes  Crisis Care Plan Living Arrangements:  Database administrator ) Name of Psychiatrist: Nnoe Reported Name of Therapist: None Reported  Education Status Is patient currently in school?: No Current Grade: NA Highest grade of school patient has completed: NA Name of school: NA Contact person: NA  Risk to self with the past 6 months Suicidal Ideation: No Has patient been a risk to self within the past 6 months prior to admission? : No Suicidal Intent: No Has patient had any suicidal intent  within the past 6 months prior to admission? : No Is patient at risk for suicide?: No Suicidal Plan?: No Has patient had any suicidal plan within the past 6 months prior to admission? : No Access to Means: No What has been your use of drugs/alcohol within the last 12 months?: None Reported Previous Attempts/Gestures: No How many times?: 0 Other Self Harm Risks: None Reported Triggers for Past Attempts: None known Intentional Self Injurious Behavior:  None Family Suicide History: No Recent stressful life event(s): Other (Comment) (None Reported) Persecutory voices/beliefs?: Yes Depression: Yes Depression Symptoms: Despondent, Isolating Substance abuse history and/or treatment for substance abuse?: No Suicide prevention information given to non-admitted patients: Not applicable  Risk to Others within the past 6 months Homicidal Ideation: No Does patient have any lifetime risk of violence toward others beyond the six months prior to admission? : No Thoughts of Harm to Others: No Current Homicidal Intent: No Current Homicidal Plan: No Access to Homicidal Means: No Identified Victim: None Reported History of harm to others?: No Assessment of Violence: None Noted Violent Behavior Description: None Reported Does patient have access to weapons?: No Criminal Charges Pending?: No Does patient have a court date: No Is patient on probation?: No  Psychosis Hallucinations: Auditory Delusions: Grandiose (Believes that someone is following him.)  Mental Status Report Appearance/Hygiene: Disheveled Eye Contact: Fair Motor Activity: Freedom of movement Speech: Pressured (Coherent) Level of Consciousness: Alert Mood: Anxious Affect: Anxious Anxiety Level: Minimal Thought Processes: Flight of Ideas Judgement: Unimpaired Orientation: Person, Place, Time, Situation Obsessive Compulsive Thoughts/Behaviors: None  Cognitive Functioning Concentration: Decreased Memory: Recent Intact, Remote Intact IQ: Average Insight: Poor Impulse Control: Fair Appetite: Fair Weight Loss: 0 Weight Gain: 0 Sleep: Decreased Total Hours of Sleep: 5 Vegetative Symptoms: None  ADLScreening [facility]) Patient's cognitive ability adequate to safely complete daily activities?: Yes Patient able to express need for assistance with ADLs?: Yes Independently performs ADLs?: Yes (appropriate for developmental age)  Prior Inpatient  Therapy Prior Inpatient Therapy: No Prior Therapy Dates: NA Prior Therapy Facilty/Provider(s): NA Reason for Treatment: NA  Prior Outpatient Therapy Prior Outpatient Therapy: No Prior Therapy Dates: NA Prior Therapy Facilty/Provider(s): NA Reason for Treatment: NA Does patient have an ACCT team?: No Does patient have Intensive In-House Services?  : No Does patient have [facility] services? : No Does patient have P4CC services?: No  ADL Screening (condition at time of admission) Patient's cognitive ability adequate to safely complete daily activities?: Yes Is the patient deaf or have difficulty hearing?: No Does the patient have difficulty seeing, even when wearing glasses/contacts?: No Does the patient have difficulty concentrating, remembering, or making decisions?: Yes Patient able to express need for assistance with ADLs?: Yes Does the patient have difficulty dressing or bathing?: No Independently performs ADLs?: Yes (appropriate for developmental age) Does the patient have difficulty walking or climbing stairs?: No Weakness of Legs: None Weakness of Arms/Hands: None  Home Assistive Devices/Equipment Home Assistive Devices/Equipment: None    Abuse/Neglect Assessment (Assessment to be complete while patient is alone) Physical Abuse: Denies Verbal Abuse: Denies Sexual Abuse: Denies Exploitation of patient/patient's resources: Denies Self-Neglect: Denies Values / Beliefs Cultural Requests During Hospitalization: None Spiritual Requests During Hospitalization: None Consults Spiritual Care Consult Needed: No Social Work Consult Needed: No Merchant navy officer (For Healthcare) Does patient have an advance directive?: No Would patient like information on creating an advanced directive?: No - patient declined information    Additional Information 1:1 In Past 12 Months?: No CIRT Risk: No Elopement  Risk: No Does patient have medical clearance?: Yes     Disposition:   Per  May, NP - patient meets criteria for inpatient hospitalizationWriter informed the Charge Nurse - Donnita Falls that the patient was coming to [facility] for medical clearance. Writer informed Merchandiser, retail that he patient does not have a bed assignment at [facility]. Writer informed the [employer] that the patient of the disposition.   Disposition Initial Assessment Completed for this Encounter: Yes Disposition of Patient: Inpatient treatment program Type of inpatient treatment program: Adult (Per may, patient meets criteria for inpatient hosp)  On Site Evaluation by:   Reviewed with Physician:    [name] 09/16/2015 7:26 PM

## 2015-09-16 NOTE — BH Assessment (Signed)
Per May, NP - patient meets criteria for inpatient hospitalization.  Patient is a walk in at [facility].  Patient reports hearing voices for the past 4 months.  Patient reports that he feels as if someone is following him because when he gets to his destination he begins to hear the voice.  Patient denies a prior history of psychiatric hospitalization or any other outpatient mental health treatment.  Patient reports that he has never heard any voices that no one else can hear in his entire life.  During the assessment the patient seemed to be responding to internal stimuli as evidenced by the patient asking me if I could hear the high pitched sound.    Writer informed the Charge Nurse - Donnita Falls that the patient was coming to [facility] for medical clearance.  Writer informed Merchandiser, retail that he patient does not have a bed assignment at [facility].  Writer informed the [employer] that the patient of the disposition.

## 2015-09-16 NOTE — BH Assessment (Signed)
Seeking inpt placement for pt, sent referrals to:  [facility], [facility], [facility], [facility], [place], [facility], [facility], [place] Triage Specialist 09/16/2015 11:48 PM

## 2015-09-17 NOTE — ED Notes (Signed)
Pt's mother into see 

## 2015-09-17 NOTE — ED Notes (Addendum)
Pt ambulatory w/o difficulty to [facility].  Belongings, MAR report, assessment note, transfer report, EMTELA, face sheet given to driver.  [facility] notified that patient is in route.

## 2015-09-17 NOTE — BH Assessment (Signed)
[facility] Assessment Progress Note   Clinician informed patient that he had been accepted to [facility].  Patient is worried about missing work.  York Spaniel he would need to make some phone calls.  Said, "I don't know how I'm going to work this out."

## 2015-09-17 NOTE — BH Assessment (Signed)
[facility] Assessment Progress Note   Amy at [facility] said that they can accept patient after 10:00. Accepting psychiatrist is Uma Thotokura. Call report number [phone]. [facility].

## 2015-09-18 DIAGNOSIS — F23 Brief psychotic disorder: Secondary | ICD-10-CM | POA: Diagnosis not present

## 2015-09-19 DIAGNOSIS — F23 Brief psychotic disorder: Secondary | ICD-10-CM | POA: Diagnosis not present

## 2015-09-20 DIAGNOSIS — F23 Brief psychotic disorder: Secondary | ICD-10-CM | POA: Diagnosis not present

## 2015-09-21 DIAGNOSIS — F23 Brief psychotic disorder: Secondary | ICD-10-CM | POA: Diagnosis not present

## 2015-09-22 DIAGNOSIS — F23 Brief psychotic disorder: Secondary | ICD-10-CM | POA: Diagnosis not present

## 2015-09-23 DIAGNOSIS — F23 Brief psychotic disorder: Secondary | ICD-10-CM | POA: Diagnosis not present

## 2015-09-24 DIAGNOSIS — F23 Brief psychotic disorder: Secondary | ICD-10-CM | POA: Diagnosis not present

## 2015-09-25 DIAGNOSIS — F23 Brief psychotic disorder: Secondary | ICD-10-CM | POA: Diagnosis not present

## 2015-09-26 DIAGNOSIS — F23 Brief psychotic disorder: Secondary | ICD-10-CM | POA: Diagnosis not present

## 2015-09-27 DIAGNOSIS — F23 Brief psychotic disorder: Secondary | ICD-10-CM | POA: Diagnosis not present

## 2015-09-28 DIAGNOSIS — F23 Brief psychotic disorder: Secondary | ICD-10-CM | POA: Diagnosis not present

## 2015-10-01 DIAGNOSIS — F203 Undifferentiated schizophrenia: Secondary | ICD-10-CM | POA: Diagnosis not present

## 2015-10-01 DIAGNOSIS — F419 Anxiety disorder, unspecified: Secondary | ICD-10-CM | POA: Diagnosis not present

## 2015-11-02 DIAGNOSIS — F203 Undifferentiated schizophrenia: Secondary | ICD-10-CM | POA: Diagnosis not present

## 2015-11-30 DIAGNOSIS — Z940 Kidney transplant status: Secondary | ICD-10-CM | POA: Diagnosis not present

## 2015-11-30 DIAGNOSIS — E785 Hyperlipidemia, unspecified: Secondary | ICD-10-CM | POA: Diagnosis not present

## 2015-11-30 DIAGNOSIS — E119 Type 2 diabetes mellitus without complications: Secondary | ICD-10-CM | POA: Diagnosis not present

## 2015-12-07 DIAGNOSIS — Z8639 Personal history of other endocrine, nutritional and metabolic disease: Secondary | ICD-10-CM | POA: Diagnosis not present

## 2015-12-07 DIAGNOSIS — E785 Hyperlipidemia, unspecified: Secondary | ICD-10-CM | POA: Diagnosis not present

## 2015-12-07 DIAGNOSIS — D899 Disorder involving the immune mechanism, unspecified: Secondary | ICD-10-CM | POA: Diagnosis not present

## 2015-12-07 DIAGNOSIS — H9319 Tinnitus, unspecified ear: Secondary | ICD-10-CM | POA: Diagnosis not present

## 2015-12-07 DIAGNOSIS — Z940 Kidney transplant status: Secondary | ICD-10-CM | POA: Diagnosis not present

## 2015-12-07 DIAGNOSIS — R440 Auditory hallucinations: Secondary | ICD-10-CM | POA: Diagnosis not present

## 2015-12-07 DIAGNOSIS — I129 Hypertensive chronic kidney disease with stage 1 through stage 4 chronic kidney disease, or unspecified chronic kidney disease: Secondary | ICD-10-CM | POA: Diagnosis not present

## 2015-12-07 DIAGNOSIS — M549 Dorsalgia, unspecified: Secondary | ICD-10-CM | POA: Diagnosis not present

## 2016-01-01 IMAGING — CR DG LUMBAR SPINE COMPLETE 4+V
5 series · 5 of 5 positions shown · non-contrast
Comparison: None.

CLINICAL DATA: Back pain.

EXAM:
LUMBAR SPINE - COMPLETE 4+ VIEW

[t lumbar spine ap]
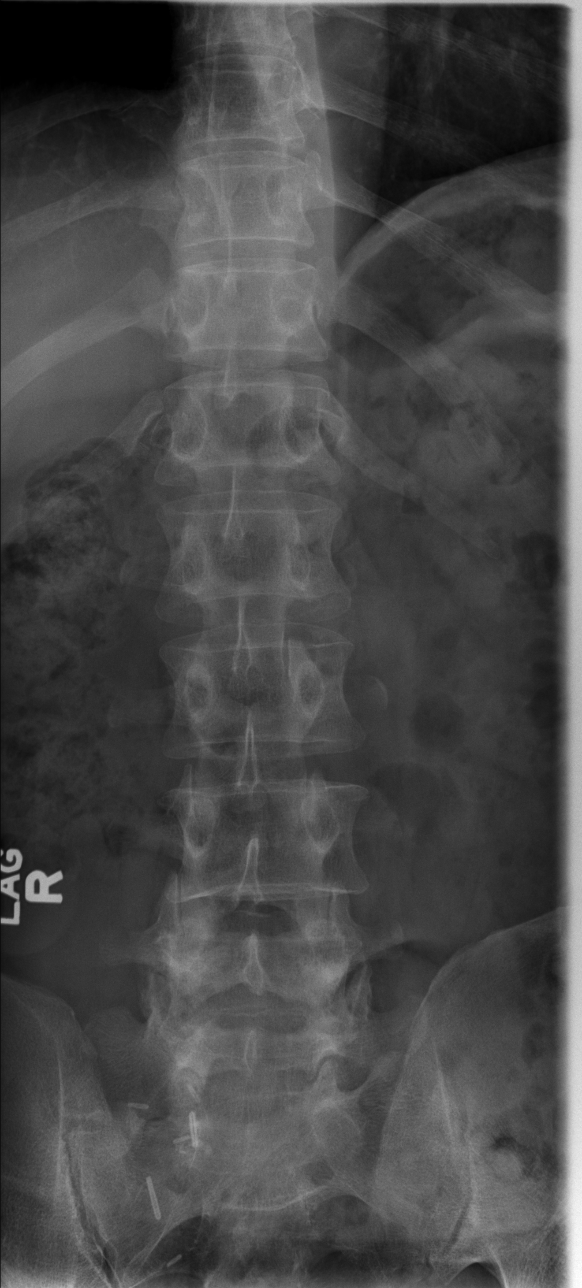

[t lumbar spine obl (1 of 2)]
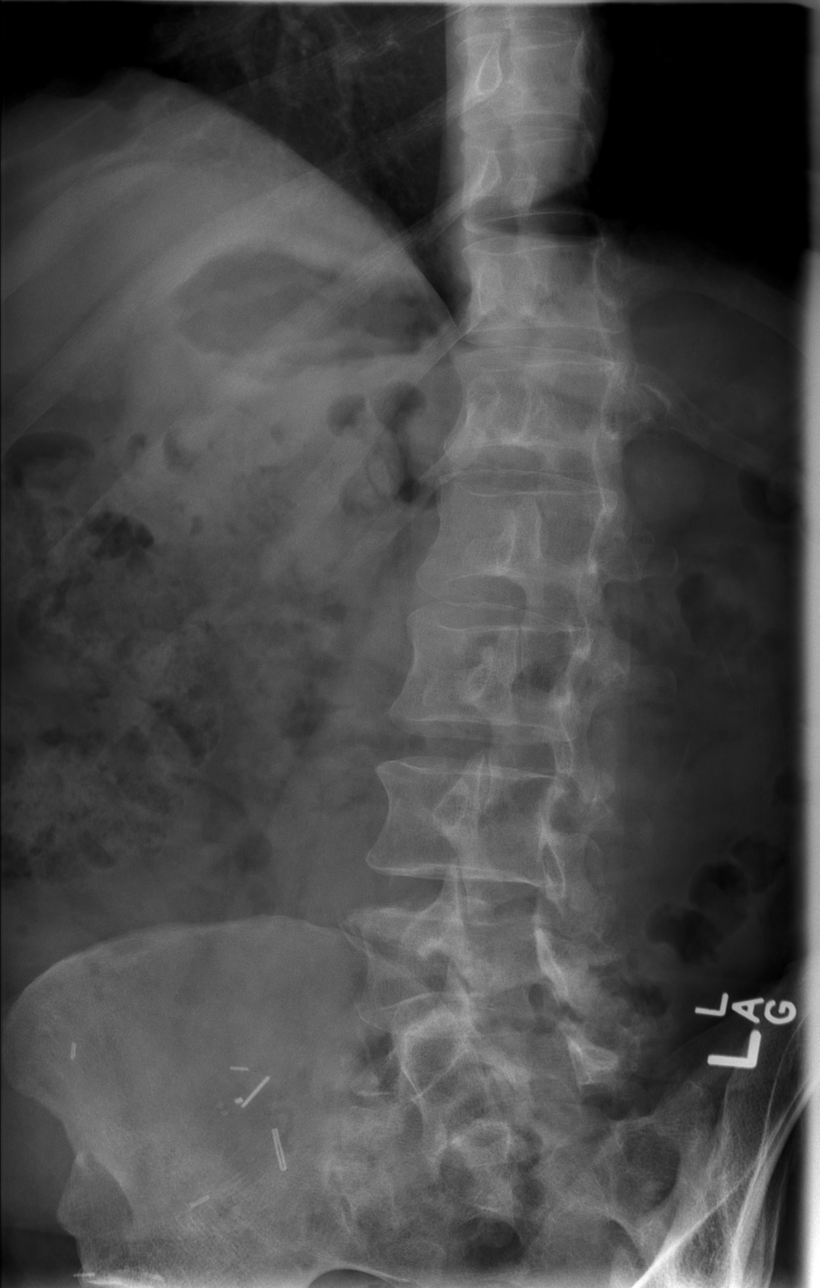

[t lumbar spine obl (2 of 2)]
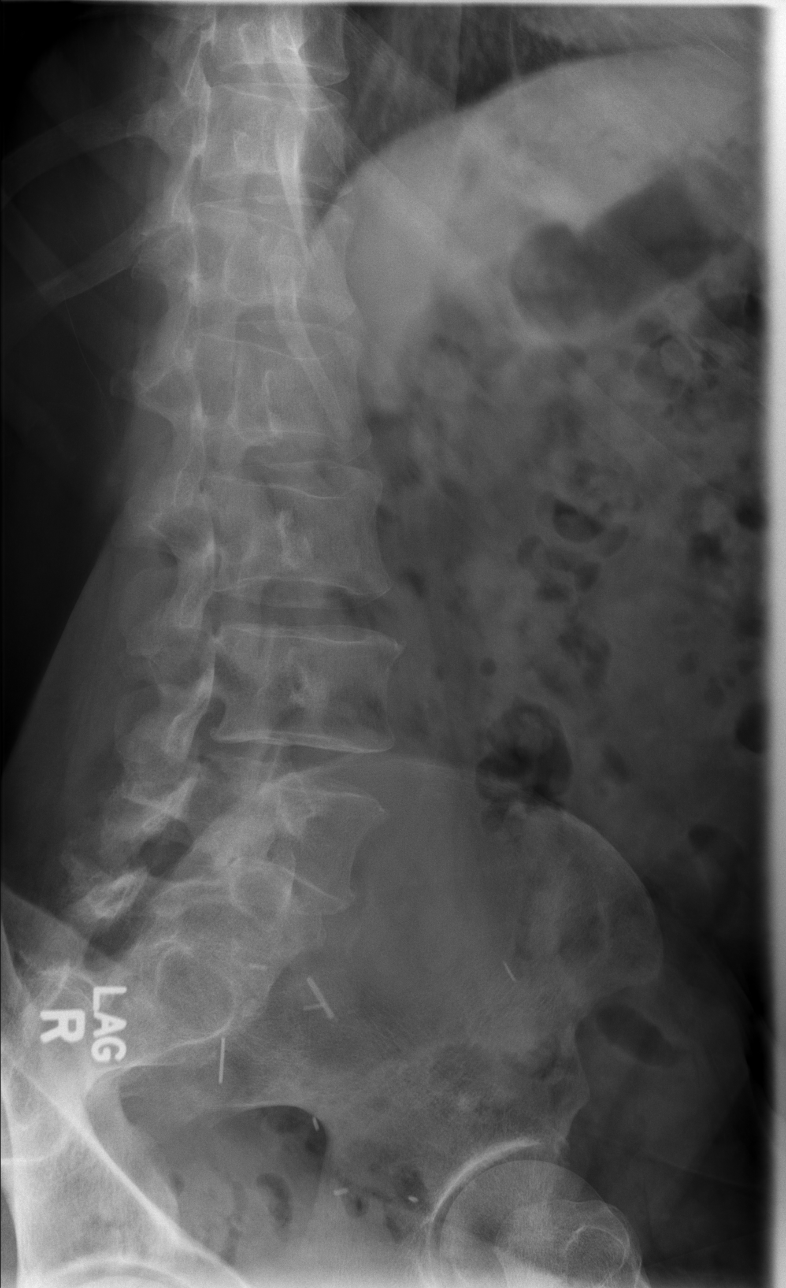

[t lumbar spine lat]
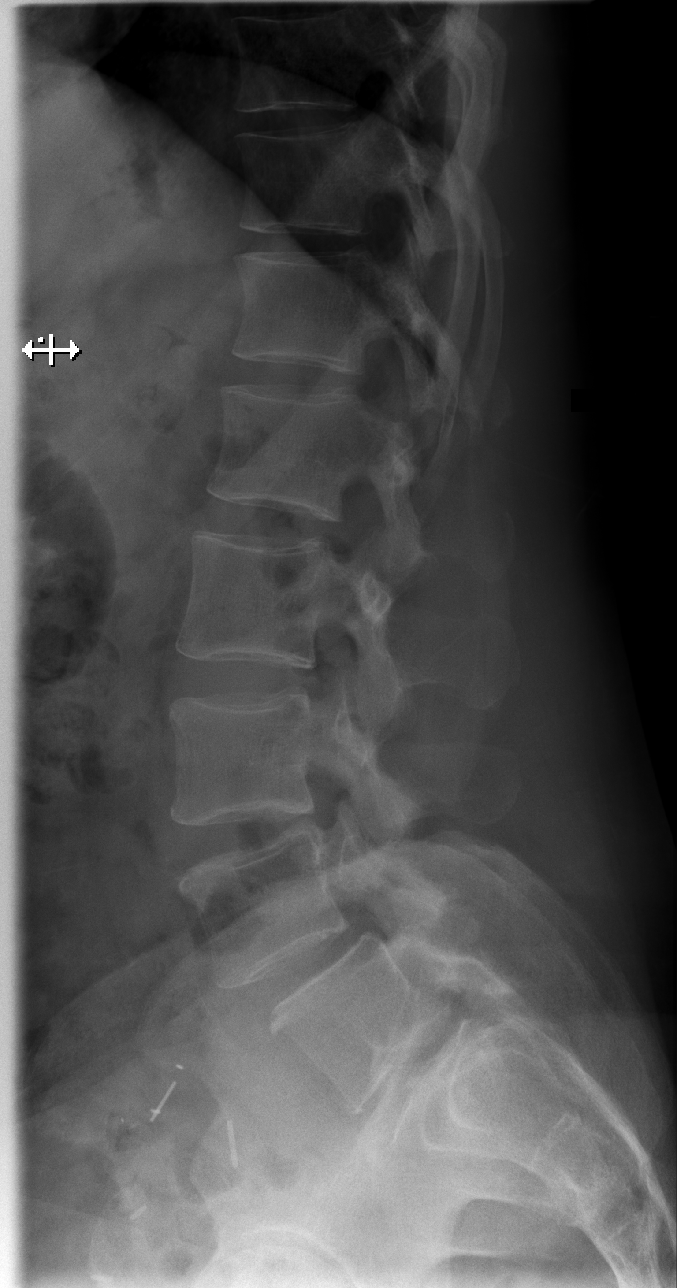

[t lumbar l-5 s-1 spot]
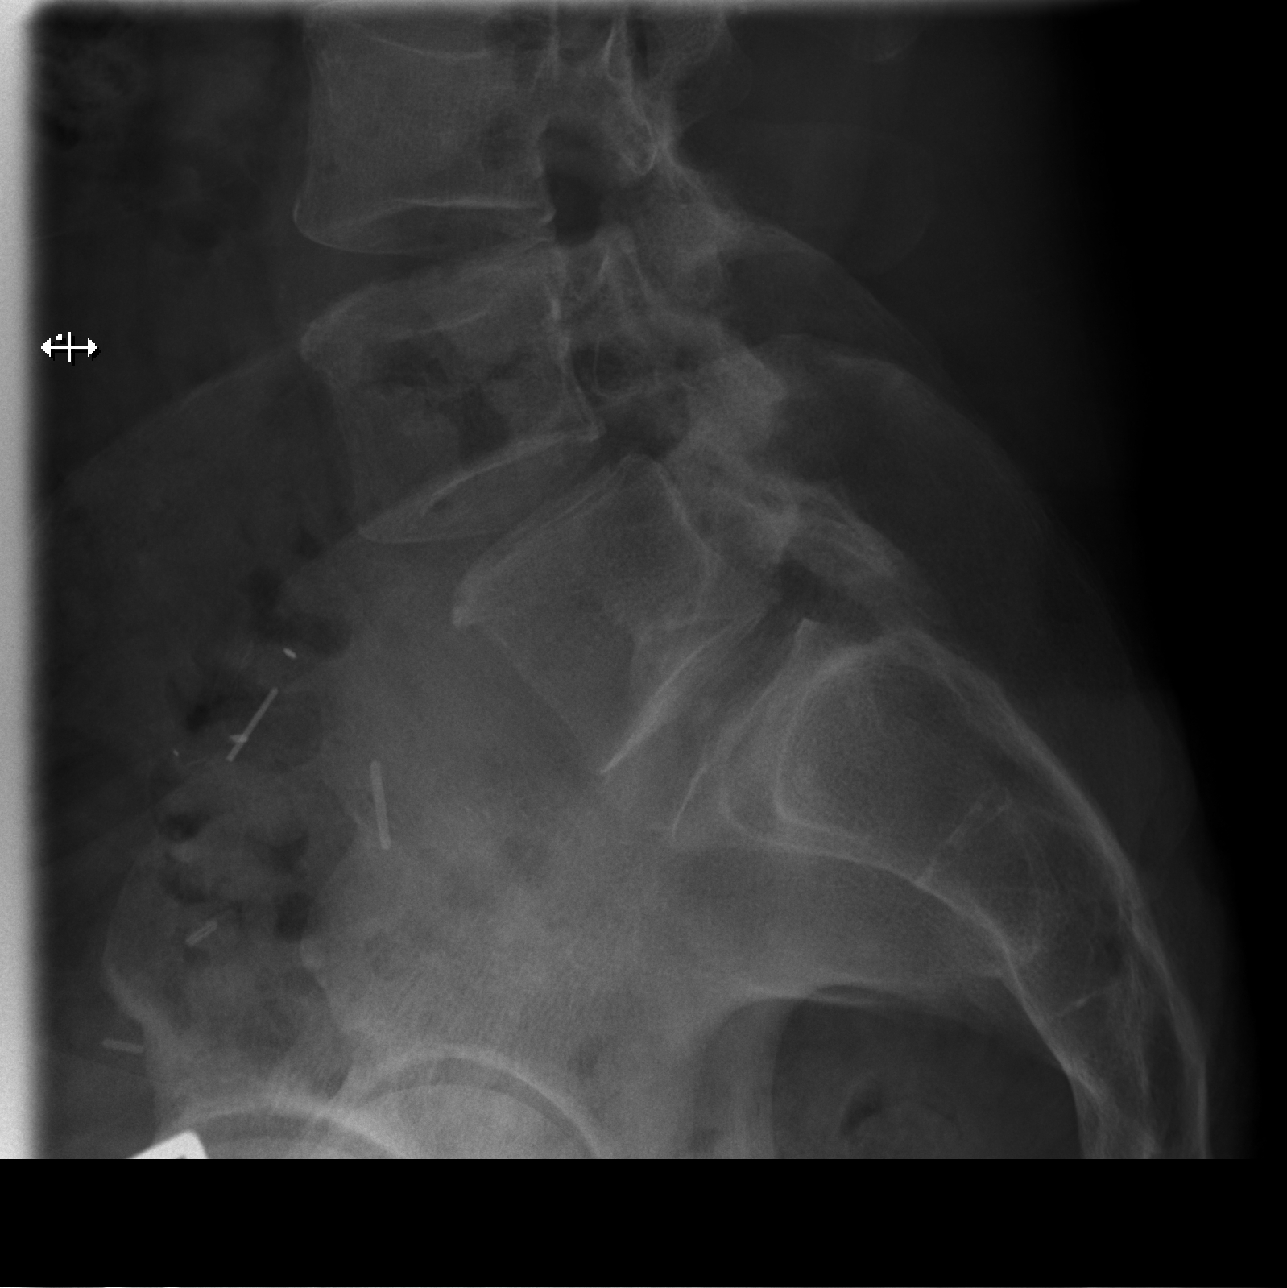

[5 of 5 positions shown; findings below may reference images not displayed]

FINDINGS: No acute fracture, osseous erosion, or bone lesion.

Grade 1 L4-5 anterolisthesis with at least unilateral (left) pars
defect at L4. No significant degenerative narrowing.
IMPRESSION: 1. No acute osseous findings.
2. L4-5 anterolisthesis with left pars interarticularis defect.

## 2016-02-23 ENCOUNTER — Emergency Department (HOSPITAL_COMMUNITY)
Admission: EM | Admit: 2016-02-23 | Discharge: 2016-02-23 | Disposition: A | Discharge status: Home / Self Care | Payer: Medicare Other | Attending: Emergency Medicine | Admitting: Emergency Medicine

## 2016-02-23 ENCOUNTER — Encounter (HOSPITAL_COMMUNITY): Payer: Self-pay | Admitting: Emergency Medicine

## 2016-02-23 DIAGNOSIS — R109 Unspecified abdominal pain: Secondary | ICD-10-CM | POA: Insufficient documentation

## 2016-02-23 DIAGNOSIS — Z79899 Other long term (current) drug therapy: Secondary | ICD-10-CM | POA: Insufficient documentation

## 2016-02-23 DIAGNOSIS — I10 Essential (primary) hypertension: Secondary | ICD-10-CM | POA: Diagnosis not present

## 2016-02-23 DIAGNOSIS — Z7952 Long term (current) use of systemic steroids: Secondary | ICD-10-CM | POA: Diagnosis not present

## 2016-02-23 DIAGNOSIS — Z87448 Personal history of other diseases of urinary system: Secondary | ICD-10-CM | POA: Diagnosis not present

## 2016-02-23 DIAGNOSIS — Z87828 Personal history of other (healed) physical injury and trauma: Secondary | ICD-10-CM | POA: Insufficient documentation

## 2016-02-23 DIAGNOSIS — R197 Diarrhea, unspecified: Secondary | ICD-10-CM | POA: Insufficient documentation

## 2016-02-23 DIAGNOSIS — E119 Type 2 diabetes mellitus without complications: Secondary | ICD-10-CM | POA: Diagnosis not present

## 2016-02-23 LAB — COMPREHENSIVE METABOLIC PANEL
ALBUMIN: 4.2 g/dL (ref 3.5–5.0)
ALK PHOS: 37 U/L — AB (ref 38–126)
ALT: 11 U/L — ABNORMAL LOW (ref 17–63)
ANION GAP: 8 (ref 5–15)
AST: 17 U/L (ref 15–41)
BILIRUBIN TOTAL: 2.4 mg/dL — AB (ref 0.3–1.2)
BUN: 20 mg/dL (ref 6–20)
CALCIUM: 9.7 mg/dL (ref 8.9–10.3)
CO2: 22 mmol/L (ref 22–32)
Chloride: 108 mmol/L (ref 101–111)
Creatinine, Ser: 0.87 mg/dL (ref 0.61–1.24)
GFR calc Af Amer: >60 mL/min (ref >60)
GLUCOSE: 129 mg/dL — AB (ref 65–99)
POTASSIUM: 3.2 mmol/L — AB (ref 3.5–5.1)
Sodium: 138 mmol/L (ref 135–145)
TOTAL PROTEIN: 7.3 g/dL (ref 6.5–8.1)

## 2016-02-23 LAB — CBC
HCT: 41.1 % (ref 39.0–52.0)
Hemoglobin: 13.1 g/dL (ref 13.0–17.0)
MCH: 26.6 pg (ref 26.0–34.0)
MCHC: 31.9 g/dL (ref 30.0–36.0)
MCV: 83.4 fL (ref 78.0–100.0)
Platelets: 265 10*3/uL (ref 150–400)
RBC: 4.93 MIL/uL (ref 4.22–5.81)
RDW: 14.4 % (ref 11.5–15.5)
WBC: 9.8 10*3/uL (ref 4.0–10.5)

## 2016-02-23 LAB — LIPASE, BLOOD: Lipase: 24 U/L (ref 11–51)

## 2016-02-23 MED ORDER — SODIUM CHLORIDE 0.9 % IV BOLUS (SEPSIS)
1000.0000 mL | Freq: Once | INTRAVENOUS | Status: AC
  Administered 2016-02-23: 1000 mL via INTRAVENOUS

## 2016-02-23 MED ORDER — POTASSIUM CHLORIDE CRYS ER 20 MEQ PO TBCR
40.0000 meq | EXTENDED_RELEASE_TABLET | Freq: Once | ORAL | Status: AC
  Administered 2016-02-23: 40 meq via ORAL
  Filled 2016-02-23: qty 2

## 2016-02-23 MED ORDER — LOPERAMIDE HCL 2 MG PO CAPS
2.0000 mg | ORAL_CAPSULE | Freq: Once | ORAL | Status: AC
  Administered 2016-02-23: 2 mg via ORAL
  Filled 2016-02-23: qty 1

## 2016-02-23 MED ORDER — LOPERAMIDE HCL 2 MG PO CAPS: 2.0000 mg | ORAL_CAPSULE | Freq: Four times a day (QID) | ORAL | Status: DC | PRN

## 2016-02-23 NOTE — ED Notes (Addendum)
Patient presents for abdominal cramping, watery diarrhea, 8+ episodes since yesterday. Denies fever, N/V. Kidney transplant 2009.

## 2016-02-23 NOTE — ED Provider Notes (Signed)
CSN: [id number]     Arrival date & time 02/23/16  1546 History   First MD Initiated Contact with Patient 02/23/16 1816     Chief Complaint  Patient presents with  . Diarrhea  . Abdominal Cramping     HPI Pt started having diarrhea this am around 2 am.  He has had 8-9 episodes since it started.  No vomiting.  He has had some cramping in the abdomen.  NO blood in the stool.  He has felt chilled but otherwise no other symptoms.  No recent abx.    No foreign travel.  No history of recurrent diarrhea illness.  Past Medical History  Diagnosis Date  . Renal failure   . Diabetes mellitus without complication (HCC)   . Hypertension   . Burn    Past Surgical History  Procedure Laterality Date  . Kidney transplant Right 2009  . Dialysis fistula creation Right   . Skin graft     Family History  Problem Relation Age of Onset  . Diabetes Mother   . Hypertension Mother   . Diabetes Father    Social History  Substance Use Topics  . Smoking status: Never Smoker   . Smokeless tobacco: None  . Alcohol Use: Yes     Comment: occ    Review of Systems  All other systems reviewed and are negative.     Allergies  Review of patient's allergies indicates no known allergies.  Home Medications   Prior to Admission medications   Medication Sig Start Date End Date Taking? Authorizing Provider  amLODipine (NORVASC) 10 MG tablet Take 10 mg by mouth daily.   Yes Historical Provider, MD  furosemide (LASIX) 40 MG tablet TK 1 T PO D 02/11/16  Yes Historical Provider, MD  ibuprofen (ADVIL,MOTRIN) 200 MG tablet Take 200 mg by mouth every 6 (six) hours as needed for fever.    Yes Historical Provider, MD  metoprolol (LOPRESSOR) 50 MG tablet Take 50 mg by mouth 2 (two) times daily.   Yes Historical Provider, MD  mycophenolate (CELLCEPT) 250 MG capsule Take 750 mg by mouth 2 (two) times daily.   Yes Historical Provider, MD  naproxen sodium (ANAPROX) 220 MG tablet Take 220 mg by mouth 2 (two) times daily  as needed (pain).   Yes Historical Provider, MD  omeprazole (PRILOSEC) 20 MG capsule Take 20 mg by mouth daily.   Yes Historical Provider, MD  predniSONE (DELTASONE) 5 MG tablet Take 1 tablet by mouth daily. 08/28/15  Yes Historical Provider, MD  tacrolimus (PROGRAF) 1 MG capsule Take 3 mg by mouth 2 (two) times daily.   Yes Historical Provider, MD  HYDROcodone-acetaminophen (NORCO/VICODIN) 5-325 MG per tablet Take 1-2 tablets by mouth every 6 (six) hours as needed for moderate pain or severe pain. Patient not taking: Reported on 09/16/2015 08/21/15   Antony Madura, PA-C  loperamide (IMODIUM) 2 MG capsule Take 1 capsule (2 mg total) by mouth 4 (four) times daily as needed for diarrhea or loose stools. 02/23/16   Linwood Dibbles, MD  risperiDONE (RISPERDAL) 2 MG tablet TK 1 T PO  QD 12/29/15   Historical Provider, MD  zolpidem (AMBIEN) 10 MG tablet TK 1 T PO QD HS 01/29/16   Historical Provider, MD   BP 123/79 mmHg  Pulse 72  Temp(Src) 99.2 F (37.3 C) (Oral)  Resp 16  SpO2 99% Physical Exam  Constitutional: He appears well-developed and well-nourished. No distress.  HENT:  Head: Normocephalic and atraumatic.  Right Ear:  External ear normal.  Left Ear: External ear normal.  Eyes: Conjunctivae are normal. Right eye exhibits no discharge. Left eye exhibits no discharge. No scleral icterus.  Neck: Neck supple. No tracheal deviation present.  Cardiovascular: Normal rate, regular rhythm and intact distal pulses.   Pulmonary/Chest: Effort normal and breath sounds normal. No stridor. No respiratory distress. He has no wheezes. He has no rales.  Abdominal: Soft. Bowel sounds are normal. He exhibits no distension. There is no tenderness. There is no rebound and no guarding.  Musculoskeletal: He exhibits no edema or tenderness.  Neurological: He is alert. He has normal strength. No cranial nerve deficit (no facial droop, extraocular movements intact, no slurred speech) or sensory deficit. He exhibits normal muscle  tone. He displays no seizure activity. Coordination normal.  Skin: Skin is warm and dry. No rash noted.  Psychiatric: He has a normal mood and affect.  Nursing note and vitals reviewed.   ED Course  Procedures (including critical care time) Labs Review Labs Reviewed  COMPREHENSIVE METABOLIC PANEL - Abnormal; Notable for the following:    Potassium 3.2 (*)    Glucose, Bld 129 (*)    ALT 11 (*)    Alkaline Phosphatase 37 (*)    Total Bilirubin 2.4 (*)    All other components within normal limits  LIPASE, BLOOD  CBC   Medications  sodium chloride 0.9 % bolus 1,000 mL (1,000 mLs Intravenous New Bag/Given 02/23/16 1854)  loperamide (IMODIUM) capsule 2 mg (2 mg Oral Given 02/23/16 1855)  potassium chloride SA (K-DUR,KLOR-CON) CR tablet 40 mEq (40 mEq Oral Given 02/23/16 1855)     MDM   Final diagnoses:  Diarrhea, unspecified type    The patient has a benign abdominal exam. He has no tenderness. Laboratory tests are reassuring. I suspect his symptoms are related to a viral gastroenteritis. Plan on discharge home with supportive medications, imodium. Follow-up with his primary care doctor.    Linwood Dibbles, MD 02/23/16 2049

## 2016-02-23 NOTE — Discharge Instructions (Signed)

## 2016-02-23 NOTE — ED Notes (Signed)
Bed: WA17 Expected date:  Expected time:  Means of arrival:  Comments: TR 2 when clean

## 2016-04-13 DIAGNOSIS — E119 Type 2 diabetes mellitus without complications: Secondary | ICD-10-CM | POA: Diagnosis not present

## 2016-04-13 DIAGNOSIS — Z940 Kidney transplant status: Secondary | ICD-10-CM | POA: Diagnosis not present

## 2016-04-13 DIAGNOSIS — E785 Hyperlipidemia, unspecified: Secondary | ICD-10-CM | POA: Diagnosis not present

## 2016-04-14 DIAGNOSIS — Z8639 Personal history of other endocrine, nutritional and metabolic disease: Secondary | ICD-10-CM | POA: Diagnosis not present

## 2016-04-14 DIAGNOSIS — R197 Diarrhea, unspecified: Secondary | ICD-10-CM | POA: Diagnosis not present

## 2016-04-14 DIAGNOSIS — I129 Hypertensive chronic kidney disease with stage 1 through stage 4 chronic kidney disease, or unspecified chronic kidney disease: Secondary | ICD-10-CM | POA: Diagnosis not present

## 2016-04-14 DIAGNOSIS — D899 Disorder involving the immune mechanism, unspecified: Secondary | ICD-10-CM | POA: Diagnosis not present

## 2016-04-14 DIAGNOSIS — E785 Hyperlipidemia, unspecified: Secondary | ICD-10-CM | POA: Diagnosis not present

## 2016-04-14 DIAGNOSIS — Z940 Kidney transplant status: Secondary | ICD-10-CM | POA: Diagnosis not present

## 2016-04-14 DIAGNOSIS — H9319 Tinnitus, unspecified ear: Secondary | ICD-10-CM | POA: Diagnosis not present

## 2016-04-14 DIAGNOSIS — R440 Auditory hallucinations: Secondary | ICD-10-CM | POA: Diagnosis not present

## 2016-08-05 DIAGNOSIS — E785 Hyperlipidemia, unspecified: Secondary | ICD-10-CM | POA: Diagnosis not present

## 2016-08-05 DIAGNOSIS — Z940 Kidney transplant status: Secondary | ICD-10-CM | POA: Diagnosis not present

## 2016-08-05 DIAGNOSIS — E119 Type 2 diabetes mellitus without complications: Secondary | ICD-10-CM | POA: Diagnosis not present

## 2016-08-09 DIAGNOSIS — R197 Diarrhea, unspecified: Secondary | ICD-10-CM | POA: Diagnosis not present

## 2016-08-09 DIAGNOSIS — M549 Dorsalgia, unspecified: Secondary | ICD-10-CM | POA: Diagnosis not present

## 2016-08-09 DIAGNOSIS — Z8639 Personal history of other endocrine, nutritional and metabolic disease: Secondary | ICD-10-CM | POA: Diagnosis not present

## 2016-08-09 DIAGNOSIS — H9319 Tinnitus, unspecified ear: Secondary | ICD-10-CM | POA: Diagnosis not present

## 2016-08-09 DIAGNOSIS — R440 Auditory hallucinations: Secondary | ICD-10-CM | POA: Diagnosis not present

## 2016-08-09 DIAGNOSIS — Z940 Kidney transplant status: Secondary | ICD-10-CM | POA: Diagnosis not present

## 2016-08-09 DIAGNOSIS — I129 Hypertensive chronic kidney disease with stage 1 through stage 4 chronic kidney disease, or unspecified chronic kidney disease: Secondary | ICD-10-CM | POA: Diagnosis not present

## 2016-08-09 DIAGNOSIS — D899 Disorder involving the immune mechanism, unspecified: Secondary | ICD-10-CM | POA: Diagnosis not present

## 2016-12-12 DIAGNOSIS — I129 Hypertensive chronic kidney disease with stage 1 through stage 4 chronic kidney disease, or unspecified chronic kidney disease: Secondary | ICD-10-CM | POA: Diagnosis not present

## 2016-12-12 DIAGNOSIS — E785 Hyperlipidemia, unspecified: Secondary | ICD-10-CM | POA: Diagnosis not present

## 2016-12-12 DIAGNOSIS — Z940 Kidney transplant status: Secondary | ICD-10-CM | POA: Diagnosis not present

## 2016-12-12 DIAGNOSIS — H9319 Tinnitus, unspecified ear: Secondary | ICD-10-CM | POA: Diagnosis not present

## 2016-12-12 DIAGNOSIS — M549 Dorsalgia, unspecified: Secondary | ICD-10-CM | POA: Diagnosis not present

## 2016-12-12 DIAGNOSIS — N390 Urinary tract infection, site not specified: Secondary | ICD-10-CM | POA: Diagnosis not present

## 2016-12-12 DIAGNOSIS — E119 Type 2 diabetes mellitus without complications: Secondary | ICD-10-CM | POA: Diagnosis not present

## 2016-12-12 DIAGNOSIS — Z0000 Encounter for general adult medical examination without abnormal findings: Secondary | ICD-10-CM | POA: Diagnosis not present

## 2016-12-12 DIAGNOSIS — D899 Disorder involving the immune mechanism, unspecified: Secondary | ICD-10-CM | POA: Diagnosis not present

## 2016-12-12 DIAGNOSIS — Z23 Encounter for immunization: Secondary | ICD-10-CM | POA: Diagnosis not present

## 2016-12-22 DIAGNOSIS — F29 Unspecified psychosis not due to a substance or known physiological condition: Secondary | ICD-10-CM | POA: Diagnosis not present

## 2016-12-27 DIAGNOSIS — F29 Unspecified psychosis not due to a substance or known physiological condition: Secondary | ICD-10-CM | POA: Diagnosis not present

## 2017-01-19 DIAGNOSIS — Z79899 Other long term (current) drug therapy: Secondary | ICD-10-CM | POA: Diagnosis not present

## 2017-01-19 DIAGNOSIS — F29 Unspecified psychosis not due to a substance or known physiological condition: Secondary | ICD-10-CM | POA: Diagnosis not present

## 2017-01-19 DIAGNOSIS — E748 Other specified disorders of carbohydrate metabolism: Secondary | ICD-10-CM | POA: Diagnosis not present

## 2017-02-28 DIAGNOSIS — F29 Unspecified psychosis not due to a substance or known physiological condition: Secondary | ICD-10-CM | POA: Diagnosis not present

## 2017-03-07 DIAGNOSIS — F29 Unspecified psychosis not due to a substance or known physiological condition: Secondary | ICD-10-CM | POA: Diagnosis not present

## 2017-03-30 DIAGNOSIS — Z940 Kidney transplant status: Secondary | ICD-10-CM | POA: Diagnosis not present

## 2017-04-17 DIAGNOSIS — Z940 Kidney transplant status: Secondary | ICD-10-CM | POA: Diagnosis not present

## 2017-04-19 DIAGNOSIS — R440 Auditory hallucinations: Secondary | ICD-10-CM | POA: Diagnosis not present

## 2017-04-19 DIAGNOSIS — R197 Diarrhea, unspecified: Secondary | ICD-10-CM | POA: Diagnosis not present

## 2017-04-19 DIAGNOSIS — Z8639 Personal history of other endocrine, nutritional and metabolic disease: Secondary | ICD-10-CM | POA: Diagnosis not present

## 2017-04-19 DIAGNOSIS — E785 Hyperlipidemia, unspecified: Secondary | ICD-10-CM | POA: Diagnosis not present

## 2017-04-19 DIAGNOSIS — Z940 Kidney transplant status: Secondary | ICD-10-CM | POA: Diagnosis not present

## 2017-04-19 DIAGNOSIS — D899 Disorder involving the immune mechanism, unspecified: Secondary | ICD-10-CM | POA: Diagnosis not present

## 2017-04-19 DIAGNOSIS — H9319 Tinnitus, unspecified ear: Secondary | ICD-10-CM | POA: Diagnosis not present

## 2017-04-19 DIAGNOSIS — M549 Dorsalgia, unspecified: Secondary | ICD-10-CM | POA: Diagnosis not present

## 2017-04-19 DIAGNOSIS — I129 Hypertensive chronic kidney disease with stage 1 through stage 4 chronic kidney disease, or unspecified chronic kidney disease: Secondary | ICD-10-CM | POA: Diagnosis not present

## 2017-04-20 DIAGNOSIS — F29 Unspecified psychosis not due to a substance or known physiological condition: Secondary | ICD-10-CM | POA: Diagnosis not present

## 2017-05-18 IMAGING — CT CT HEAD W/O CM
2 series · 17 of 30 positions shown, 20 images · non-contrast
Comparison: None.

CLINICAL DATA: Hallucinations.

EXAM:
CT HEAD WITHOUT CONTRAST
TECHNIQUE: Contiguous axial images were obtained from the base of the skull
through the vertex without intravenous contrast.

[Series 2: head w/o · axial · non-contrast · 0.46mm/px · z∈[-104,+21]mm · 9 of 33 slices shown, 12 images]
[im 4/33  brain]
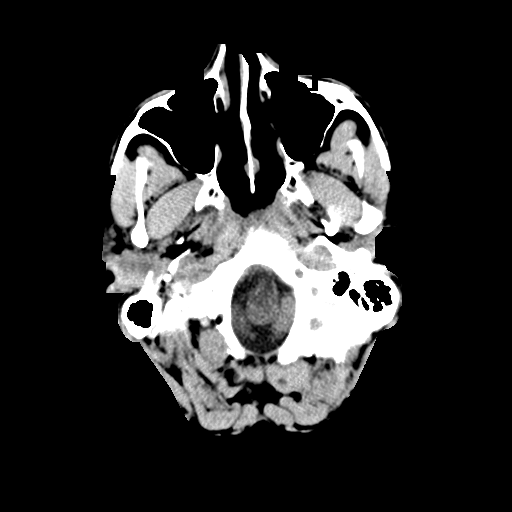
[im 4/33  bone]
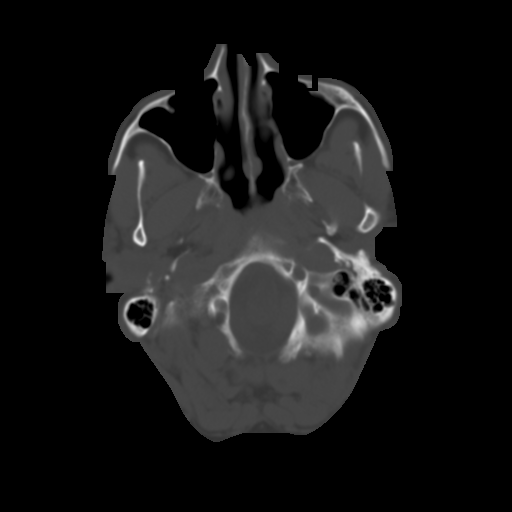
[im 7/33  brain]
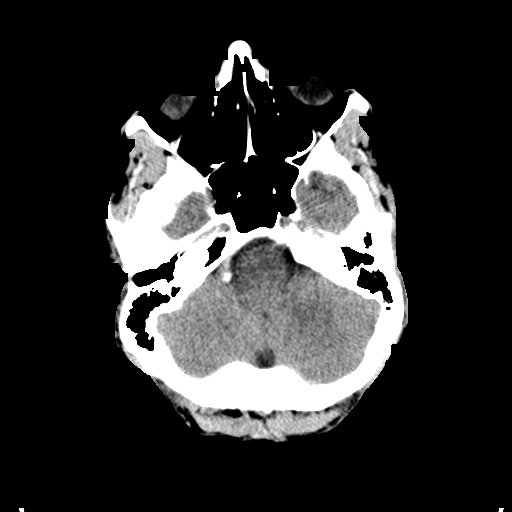
[im 10/33  brain]
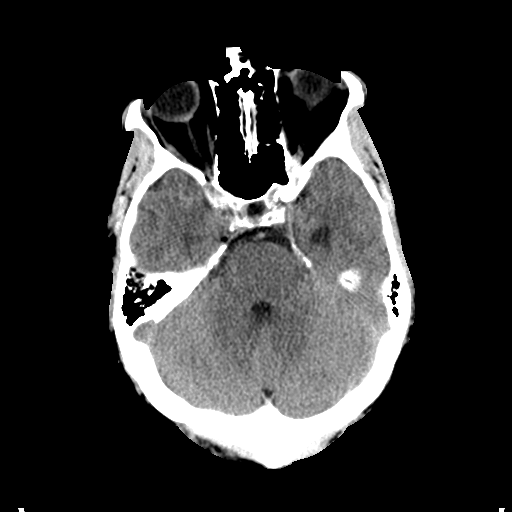
[im 13/33  brain]
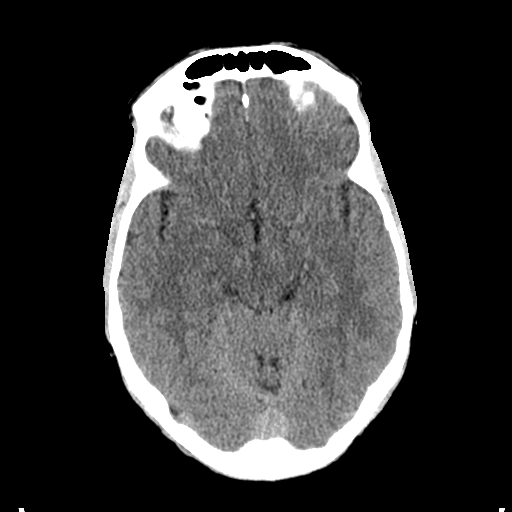
[im 17/33  brain]
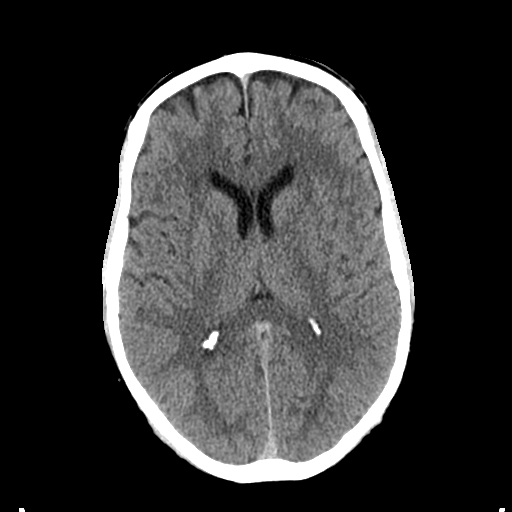
[im 17/33  bone]
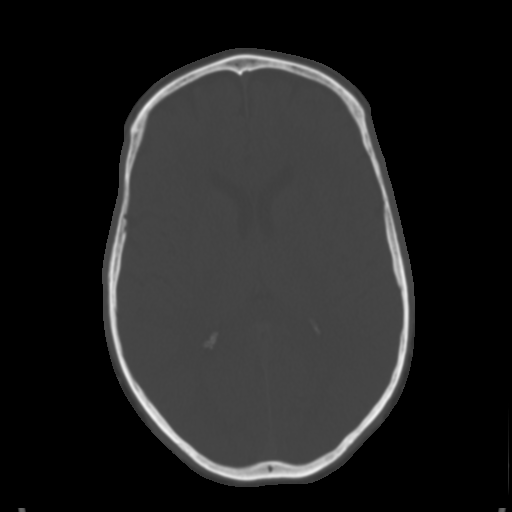
[im 20/33  brain]
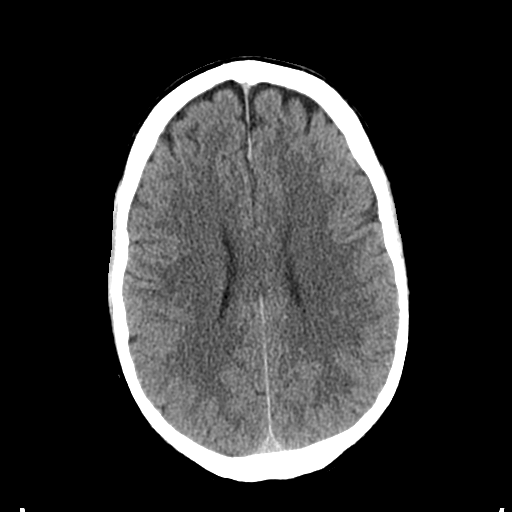
[im 23/33  brain]
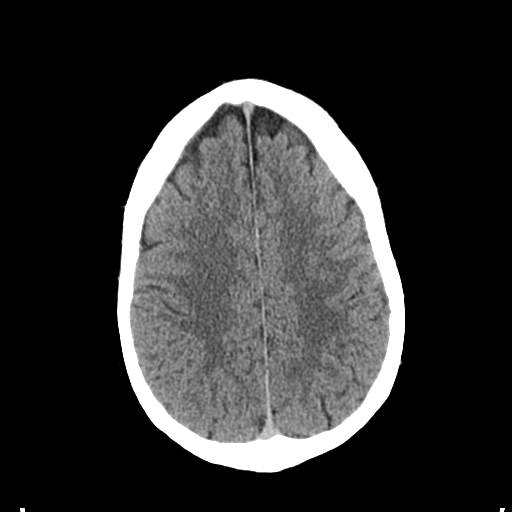
[im 26/33  brain]
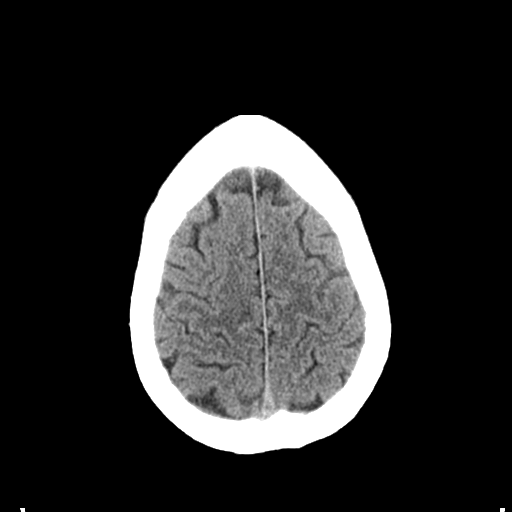
[im 29/33  brain]
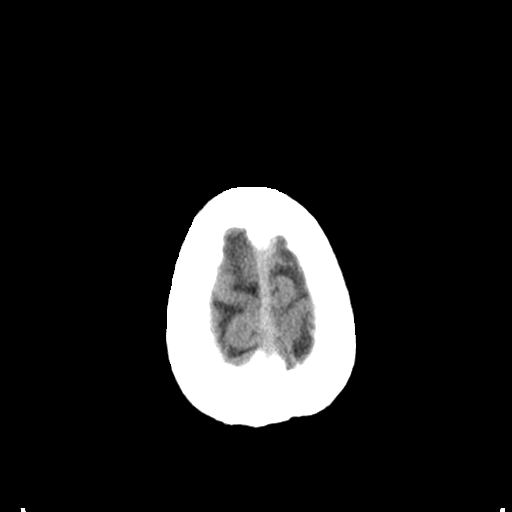
[im 29/33  bone]
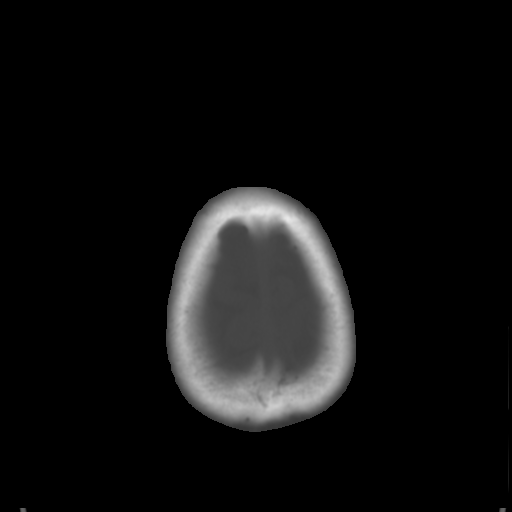

[Series 3: bone windows · axial · 0.46mm/px · z∈[-101,+25]mm · 8 of 55 slices shown]
[im 7/55  bone]
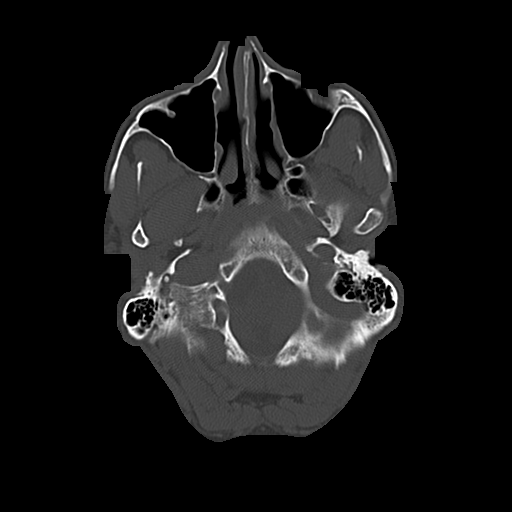
[im 13/55  bone]
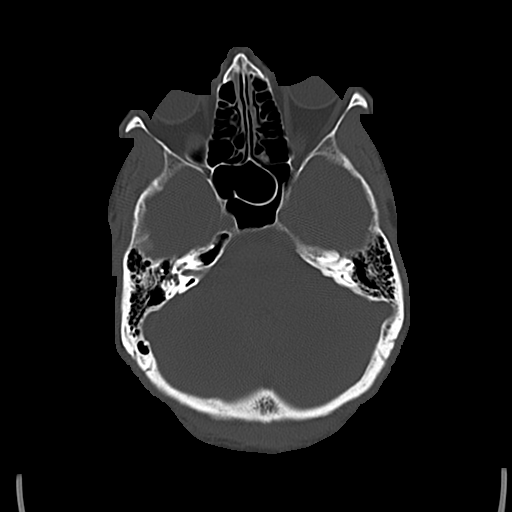
[im 19/55  bone]
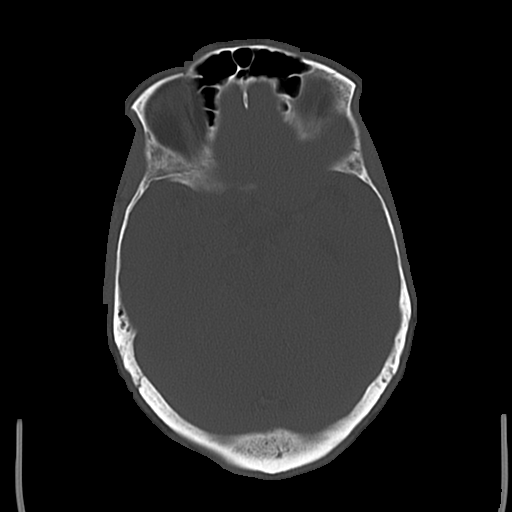
[im 25/55  bone]
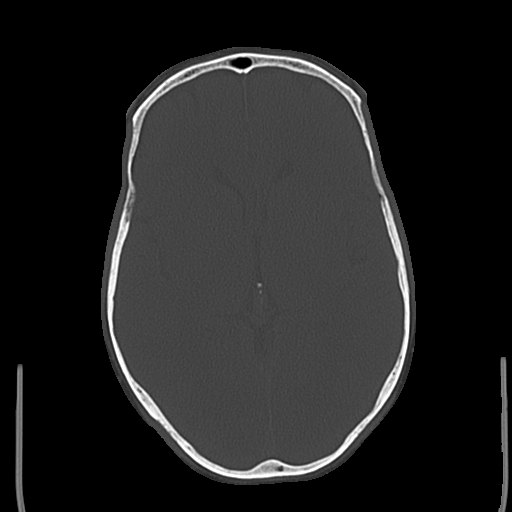
[im 31/55  bone]
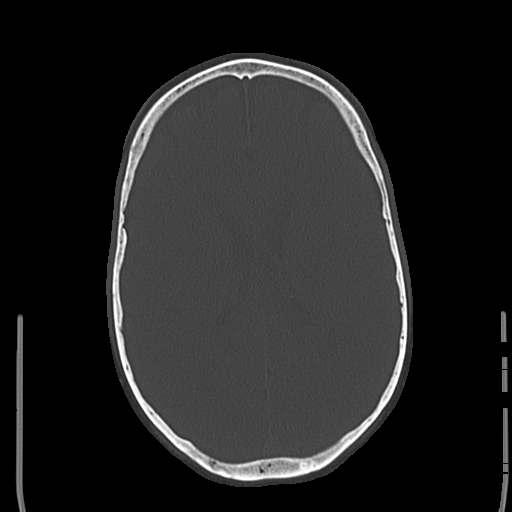
[im 37/55  bone]
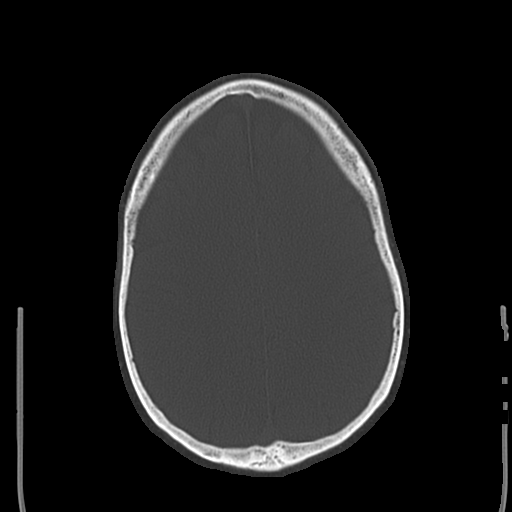
[im 43/55  bone]
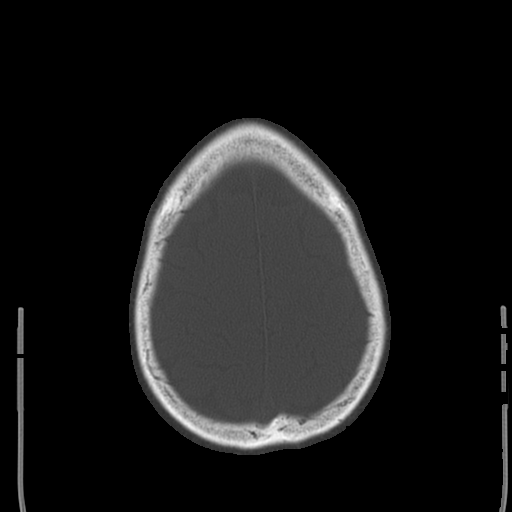
[im 49/55  bone]
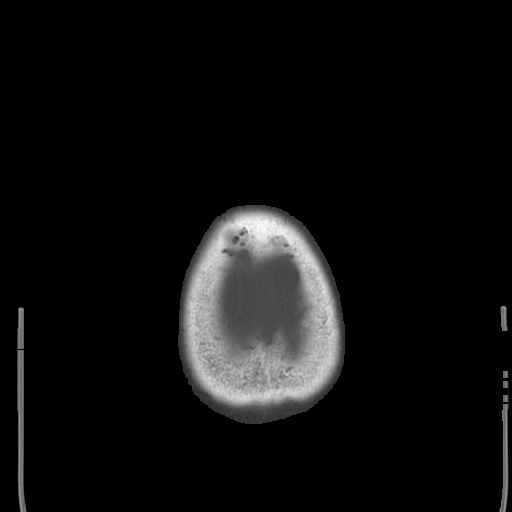

[17 of 30 positions shown; findings below may reference images not displayed]

FINDINGS: Bony calvarium appears intact. No mass effect or midline shift is
noted. Ventricular size is within normal limits. There is no
evidence of mass lesion, hemorrhage or acute infarction.
IMPRESSION: Normal head CT.

## 2017-08-08 DIAGNOSIS — Z940 Kidney transplant status: Secondary | ICD-10-CM | POA: Diagnosis not present

## 2017-08-08 DIAGNOSIS — E119 Type 2 diabetes mellitus without complications: Secondary | ICD-10-CM | POA: Diagnosis not present

## 2017-08-08 DIAGNOSIS — E785 Hyperlipidemia, unspecified: Secondary | ICD-10-CM | POA: Diagnosis not present

## 2017-08-10 DIAGNOSIS — F29 Unspecified psychosis not due to a substance or known physiological condition: Secondary | ICD-10-CM | POA: Diagnosis not present

## 2017-08-14 DIAGNOSIS — M549 Dorsalgia, unspecified: Secondary | ICD-10-CM | POA: Diagnosis not present

## 2017-08-14 DIAGNOSIS — I129 Hypertensive chronic kidney disease with stage 1 through stage 4 chronic kidney disease, or unspecified chronic kidney disease: Secondary | ICD-10-CM | POA: Diagnosis not present

## 2017-08-14 DIAGNOSIS — E785 Hyperlipidemia, unspecified: Secondary | ICD-10-CM | POA: Diagnosis not present

## 2017-08-14 DIAGNOSIS — D899 Disorder involving the immune mechanism, unspecified: Secondary | ICD-10-CM | POA: Diagnosis not present

## 2017-08-14 DIAGNOSIS — Z8639 Personal history of other endocrine, nutritional and metabolic disease: Secondary | ICD-10-CM | POA: Diagnosis not present

## 2017-08-14 DIAGNOSIS — Z940 Kidney transplant status: Secondary | ICD-10-CM | POA: Diagnosis not present

## 2017-08-14 DIAGNOSIS — H9319 Tinnitus, unspecified ear: Secondary | ICD-10-CM | POA: Diagnosis not present

## 2017-08-14 DIAGNOSIS — R440 Auditory hallucinations: Secondary | ICD-10-CM | POA: Diagnosis not present

## 2017-08-14 DIAGNOSIS — Z6825 Body mass index (BMI) 25.0-25.9, adult: Secondary | ICD-10-CM | POA: Diagnosis not present

## 2017-12-14 DIAGNOSIS — F29 Unspecified psychosis not due to a substance or known physiological condition: Secondary | ICD-10-CM | POA: Diagnosis not present

## 2017-12-15 DIAGNOSIS — Z940 Kidney transplant status: Secondary | ICD-10-CM | POA: Diagnosis not present

## 2017-12-15 DIAGNOSIS — E785 Hyperlipidemia, unspecified: Secondary | ICD-10-CM | POA: Diagnosis not present

## 2017-12-15 DIAGNOSIS — E119 Type 2 diabetes mellitus without complications: Secondary | ICD-10-CM | POA: Diagnosis not present

## 2017-12-21 DIAGNOSIS — R440 Auditory hallucinations: Secondary | ICD-10-CM | POA: Diagnosis not present

## 2017-12-21 DIAGNOSIS — Z940 Kidney transplant status: Secondary | ICD-10-CM | POA: Diagnosis not present

## 2017-12-21 DIAGNOSIS — Z6825 Body mass index (BMI) 25.0-25.9, adult: Secondary | ICD-10-CM | POA: Diagnosis not present

## 2017-12-21 DIAGNOSIS — I129 Hypertensive chronic kidney disease with stage 1 through stage 4 chronic kidney disease, or unspecified chronic kidney disease: Secondary | ICD-10-CM | POA: Diagnosis not present

## 2017-12-21 DIAGNOSIS — M549 Dorsalgia, unspecified: Secondary | ICD-10-CM | POA: Diagnosis not present

## 2017-12-21 DIAGNOSIS — Z8639 Personal history of other endocrine, nutritional and metabolic disease: Secondary | ICD-10-CM | POA: Diagnosis not present

## 2017-12-21 DIAGNOSIS — H9319 Tinnitus, unspecified ear: Secondary | ICD-10-CM | POA: Diagnosis not present

## 2017-12-21 DIAGNOSIS — E785 Hyperlipidemia, unspecified: Secondary | ICD-10-CM | POA: Diagnosis not present

## 2017-12-21 DIAGNOSIS — R197 Diarrhea, unspecified: Secondary | ICD-10-CM | POA: Diagnosis not present

## 2017-12-21 DIAGNOSIS — D899 Disorder involving the immune mechanism, unspecified: Secondary | ICD-10-CM | POA: Diagnosis not present

## 2018-01-18 DIAGNOSIS — F29 Unspecified psychosis not due to a substance or known physiological condition: Secondary | ICD-10-CM | POA: Diagnosis not present

## 2018-04-17 DIAGNOSIS — Z6825 Body mass index (BMI) 25.0-25.9, adult: Secondary | ICD-10-CM | POA: Diagnosis not present

## 2018-04-17 DIAGNOSIS — Z8639 Personal history of other endocrine, nutritional and metabolic disease: Secondary | ICD-10-CM | POA: Diagnosis not present

## 2018-04-17 DIAGNOSIS — E785 Hyperlipidemia, unspecified: Secondary | ICD-10-CM | POA: Diagnosis not present

## 2018-04-17 DIAGNOSIS — Z940 Kidney transplant status: Secondary | ICD-10-CM | POA: Diagnosis not present

## 2018-04-17 DIAGNOSIS — R440 Auditory hallucinations: Secondary | ICD-10-CM | POA: Diagnosis not present

## 2018-04-17 DIAGNOSIS — E119 Type 2 diabetes mellitus without complications: Secondary | ICD-10-CM | POA: Diagnosis not present

## 2018-04-17 DIAGNOSIS — D899 Disorder involving the immune mechanism, unspecified: Secondary | ICD-10-CM | POA: Diagnosis not present

## 2018-04-17 DIAGNOSIS — M549 Dorsalgia, unspecified: Secondary | ICD-10-CM | POA: Diagnosis not present

## 2018-04-17 DIAGNOSIS — I129 Hypertensive chronic kidney disease with stage 1 through stage 4 chronic kidney disease, or unspecified chronic kidney disease: Secondary | ICD-10-CM | POA: Diagnosis not present

## 2018-05-01 DIAGNOSIS — F29 Unspecified psychosis not due to a substance or known physiological condition: Secondary | ICD-10-CM | POA: Diagnosis not present

## 2018-07-19 DIAGNOSIS — F29 Unspecified psychosis not due to a substance or known physiological condition: Secondary | ICD-10-CM | POA: Diagnosis not present

## 2018-07-19 DIAGNOSIS — F329 Major depressive disorder, single episode, unspecified: Secondary | ICD-10-CM | POA: Diagnosis not present

## 2018-08-23 DIAGNOSIS — M549 Dorsalgia, unspecified: Secondary | ICD-10-CM | POA: Diagnosis not present

## 2018-08-23 DIAGNOSIS — Z794 Long term (current) use of insulin: Secondary | ICD-10-CM | POA: Diagnosis not present

## 2018-08-23 DIAGNOSIS — E785 Hyperlipidemia, unspecified: Secondary | ICD-10-CM | POA: Diagnosis not present

## 2018-08-23 DIAGNOSIS — Z6825 Body mass index (BMI) 25.0-25.9, adult: Secondary | ICD-10-CM | POA: Diagnosis not present

## 2018-08-23 DIAGNOSIS — Z8639 Personal history of other endocrine, nutritional and metabolic disease: Secondary | ICD-10-CM | POA: Diagnosis not present

## 2018-08-23 DIAGNOSIS — D899 Disorder involving the immune mechanism, unspecified: Secondary | ICD-10-CM | POA: Diagnosis not present

## 2018-08-23 DIAGNOSIS — Z940 Kidney transplant status: Secondary | ICD-10-CM | POA: Diagnosis not present

## 2018-08-23 DIAGNOSIS — R440 Auditory hallucinations: Secondary | ICD-10-CM | POA: Diagnosis not present

## 2018-08-23 DIAGNOSIS — I129 Hypertensive chronic kidney disease with stage 1 through stage 4 chronic kidney disease, or unspecified chronic kidney disease: Secondary | ICD-10-CM | POA: Diagnosis not present

## 2018-10-23 DIAGNOSIS — F329 Major depressive disorder, single episode, unspecified: Secondary | ICD-10-CM | POA: Diagnosis not present

## 2018-10-23 DIAGNOSIS — F29 Unspecified psychosis not due to a substance or known physiological condition: Secondary | ICD-10-CM | POA: Diagnosis not present

## 2018-12-27 DIAGNOSIS — Z940 Kidney transplant status: Secondary | ICD-10-CM | POA: Diagnosis not present

## 2018-12-27 DIAGNOSIS — E119 Type 2 diabetes mellitus without complications: Secondary | ICD-10-CM | POA: Diagnosis not present

## 2018-12-27 DIAGNOSIS — E785 Hyperlipidemia, unspecified: Secondary | ICD-10-CM | POA: Diagnosis not present

## 2018-12-27 DIAGNOSIS — I129 Hypertensive chronic kidney disease with stage 1 through stage 4 chronic kidney disease, or unspecified chronic kidney disease: Secondary | ICD-10-CM | POA: Diagnosis not present

## 2018-12-31 DIAGNOSIS — E785 Hyperlipidemia, unspecified: Secondary | ICD-10-CM | POA: Diagnosis not present

## 2018-12-31 DIAGNOSIS — I129 Hypertensive chronic kidney disease with stage 1 through stage 4 chronic kidney disease, or unspecified chronic kidney disease: Secondary | ICD-10-CM | POA: Diagnosis not present

## 2018-12-31 DIAGNOSIS — D899 Disorder involving the immune mechanism, unspecified: Secondary | ICD-10-CM | POA: Diagnosis not present

## 2018-12-31 DIAGNOSIS — Z8639 Personal history of other endocrine, nutritional and metabolic disease: Secondary | ICD-10-CM | POA: Diagnosis not present

## 2018-12-31 DIAGNOSIS — Z6825 Body mass index (BMI) 25.0-25.9, adult: Secondary | ICD-10-CM | POA: Diagnosis not present

## 2018-12-31 DIAGNOSIS — R440 Auditory hallucinations: Secondary | ICD-10-CM | POA: Diagnosis not present

## 2018-12-31 DIAGNOSIS — M549 Dorsalgia, unspecified: Secondary | ICD-10-CM | POA: Diagnosis not present

## 2018-12-31 DIAGNOSIS — Z940 Kidney transplant status: Secondary | ICD-10-CM | POA: Diagnosis not present

## 2019-01-17 DIAGNOSIS — F329 Major depressive disorder, single episode, unspecified: Secondary | ICD-10-CM | POA: Diagnosis not present

## 2019-01-17 DIAGNOSIS — F29 Unspecified psychosis not due to a substance or known physiological condition: Secondary | ICD-10-CM | POA: Diagnosis not present

## 2019-04-26 DIAGNOSIS — Z940 Kidney transplant status: Secondary | ICD-10-CM | POA: Diagnosis not present

## 2019-04-26 DIAGNOSIS — E119 Type 2 diabetes mellitus without complications: Secondary | ICD-10-CM | POA: Diagnosis not present

## 2019-04-26 DIAGNOSIS — I129 Hypertensive chronic kidney disease with stage 1 through stage 4 chronic kidney disease, or unspecified chronic kidney disease: Secondary | ICD-10-CM | POA: Diagnosis not present

## 2019-04-30 DIAGNOSIS — Z940 Kidney transplant status: Secondary | ICD-10-CM | POA: Diagnosis not present

## 2019-04-30 DIAGNOSIS — Z6825 Body mass index (BMI) 25.0-25.9, adult: Secondary | ICD-10-CM | POA: Diagnosis not present

## 2019-04-30 DIAGNOSIS — I129 Hypertensive chronic kidney disease with stage 1 through stage 4 chronic kidney disease, or unspecified chronic kidney disease: Secondary | ICD-10-CM | POA: Diagnosis not present

## 2019-04-30 DIAGNOSIS — Z8639 Personal history of other endocrine, nutritional and metabolic disease: Secondary | ICD-10-CM | POA: Diagnosis not present

## 2019-04-30 DIAGNOSIS — M549 Dorsalgia, unspecified: Secondary | ICD-10-CM | POA: Diagnosis not present

## 2019-04-30 DIAGNOSIS — D899 Disorder involving the immune mechanism, unspecified: Secondary | ICD-10-CM | POA: Diagnosis not present

## 2019-04-30 DIAGNOSIS — E785 Hyperlipidemia, unspecified: Secondary | ICD-10-CM | POA: Diagnosis not present

## 2019-04-30 DIAGNOSIS — R440 Auditory hallucinations: Secondary | ICD-10-CM | POA: Diagnosis not present

## 2019-05-16 DIAGNOSIS — F29 Unspecified psychosis not due to a substance or known physiological condition: Secondary | ICD-10-CM | POA: Diagnosis not present

## 2019-09-03 DIAGNOSIS — E785 Hyperlipidemia, unspecified: Secondary | ICD-10-CM | POA: Diagnosis not present

## 2019-09-03 DIAGNOSIS — Z1159 Encounter for screening for other viral diseases: Secondary | ICD-10-CM | POA: Diagnosis not present

## 2019-09-03 DIAGNOSIS — I129 Hypertensive chronic kidney disease with stage 1 through stage 4 chronic kidney disease, or unspecified chronic kidney disease: Secondary | ICD-10-CM | POA: Diagnosis not present

## 2019-09-03 DIAGNOSIS — Z8639 Personal history of other endocrine, nutritional and metabolic disease: Secondary | ICD-10-CM | POA: Diagnosis not present

## 2019-09-03 DIAGNOSIS — D899 Disorder involving the immune mechanism, unspecified: Secondary | ICD-10-CM | POA: Diagnosis not present

## 2019-09-03 DIAGNOSIS — Z940 Kidney transplant status: Secondary | ICD-10-CM | POA: Diagnosis not present

## 2019-09-03 DIAGNOSIS — M549 Dorsalgia, unspecified: Secondary | ICD-10-CM | POA: Diagnosis not present

## 2019-09-03 DIAGNOSIS — E119 Type 2 diabetes mellitus without complications: Secondary | ICD-10-CM | POA: Diagnosis not present

## 2019-09-03 DIAGNOSIS — Z6825 Body mass index (BMI) 25.0-25.9, adult: Secondary | ICD-10-CM | POA: Diagnosis not present

## 2019-09-03 DIAGNOSIS — R440 Auditory hallucinations: Secondary | ICD-10-CM | POA: Diagnosis not present

## 2019-10-16 DIAGNOSIS — F29 Unspecified psychosis not due to a substance or known physiological condition: Secondary | ICD-10-CM | POA: Diagnosis not present

## 2019-10-16 DIAGNOSIS — F329 Major depressive disorder, single episode, unspecified: Secondary | ICD-10-CM | POA: Diagnosis not present

## 2020-02-04 DIAGNOSIS — E119 Type 2 diabetes mellitus without complications: Secondary | ICD-10-CM | POA: Diagnosis not present

## 2020-02-04 DIAGNOSIS — Z8639 Personal history of other endocrine, nutritional and metabolic disease: Secondary | ICD-10-CM | POA: Diagnosis not present

## 2020-02-04 DIAGNOSIS — M549 Dorsalgia, unspecified: Secondary | ICD-10-CM | POA: Diagnosis not present

## 2020-02-04 DIAGNOSIS — Z6825 Body mass index (BMI) 25.0-25.9, adult: Secondary | ICD-10-CM | POA: Diagnosis not present

## 2020-02-04 DIAGNOSIS — Z940 Kidney transplant status: Secondary | ICD-10-CM | POA: Diagnosis not present

## 2020-02-04 DIAGNOSIS — R440 Auditory hallucinations: Secondary | ICD-10-CM | POA: Diagnosis not present

## 2020-02-04 DIAGNOSIS — I129 Hypertensive chronic kidney disease with stage 1 through stage 4 chronic kidney disease, or unspecified chronic kidney disease: Secondary | ICD-10-CM | POA: Diagnosis not present

## 2020-02-04 DIAGNOSIS — E785 Hyperlipidemia, unspecified: Secondary | ICD-10-CM | POA: Diagnosis not present

## 2020-02-04 DIAGNOSIS — Z1159 Encounter for screening for other viral diseases: Secondary | ICD-10-CM | POA: Diagnosis not present

## 2020-02-04 DIAGNOSIS — D849 Immunodeficiency, unspecified: Secondary | ICD-10-CM | POA: Diagnosis not present

## 2020-05-28 DIAGNOSIS — I129 Hypertensive chronic kidney disease with stage 1 through stage 4 chronic kidney disease, or unspecified chronic kidney disease: Secondary | ICD-10-CM | POA: Diagnosis not present

## 2020-05-28 DIAGNOSIS — D849 Immunodeficiency, unspecified: Secondary | ICD-10-CM | POA: Diagnosis not present

## 2020-05-28 DIAGNOSIS — Z940 Kidney transplant status: Secondary | ICD-10-CM | POA: Diagnosis not present

## 2020-05-28 DIAGNOSIS — M549 Dorsalgia, unspecified: Secondary | ICD-10-CM | POA: Diagnosis not present

## 2020-05-28 DIAGNOSIS — Z8639 Personal history of other endocrine, nutritional and metabolic disease: Secondary | ICD-10-CM | POA: Diagnosis not present

## 2020-05-28 DIAGNOSIS — R440 Auditory hallucinations: Secondary | ICD-10-CM | POA: Diagnosis not present

## 2020-05-28 DIAGNOSIS — E785 Hyperlipidemia, unspecified: Secondary | ICD-10-CM | POA: Diagnosis not present

## 2020-05-28 DIAGNOSIS — E119 Type 2 diabetes mellitus without complications: Secondary | ICD-10-CM | POA: Diagnosis not present

## 2020-05-28 DIAGNOSIS — Z6825 Body mass index (BMI) 25.0-25.9, adult: Secondary | ICD-10-CM | POA: Diagnosis not present

## 2020-10-07 DIAGNOSIS — R7309 Other abnormal glucose: Secondary | ICD-10-CM | POA: Diagnosis not present

## 2020-10-07 DIAGNOSIS — E785 Hyperlipidemia, unspecified: Secondary | ICD-10-CM | POA: Diagnosis not present

## 2020-10-07 DIAGNOSIS — M549 Dorsalgia, unspecified: Secondary | ICD-10-CM | POA: Diagnosis not present

## 2020-10-07 DIAGNOSIS — D849 Immunodeficiency, unspecified: Secondary | ICD-10-CM | POA: Diagnosis not present

## 2020-10-07 DIAGNOSIS — Z6825 Body mass index (BMI) 25.0-25.9, adult: Secondary | ICD-10-CM | POA: Diagnosis not present

## 2020-10-07 DIAGNOSIS — Z940 Kidney transplant status: Secondary | ICD-10-CM | POA: Diagnosis not present

## 2020-10-07 DIAGNOSIS — Z8639 Personal history of other endocrine, nutritional and metabolic disease: Secondary | ICD-10-CM | POA: Diagnosis not present

## 2020-10-07 DIAGNOSIS — R440 Auditory hallucinations: Secondary | ICD-10-CM | POA: Diagnosis not present

## 2020-10-07 DIAGNOSIS — I129 Hypertensive chronic kidney disease with stage 1 through stage 4 chronic kidney disease, or unspecified chronic kidney disease: Secondary | ICD-10-CM | POA: Diagnosis not present

## 2020-10-09 DIAGNOSIS — F29 Unspecified psychosis not due to a substance or known physiological condition: Secondary | ICD-10-CM | POA: Diagnosis not present

## 2020-10-09 DIAGNOSIS — F0281 Dementia in other diseases classified elsewhere with behavioral disturbance: Secondary | ICD-10-CM | POA: Diagnosis not present

## 2020-10-09 DIAGNOSIS — F330 Major depressive disorder, recurrent, mild: Secondary | ICD-10-CM | POA: Diagnosis not present

## 2020-10-26 DIAGNOSIS — E0829 Diabetes mellitus due to underlying condition with other diabetic kidney complication: Secondary | ICD-10-CM | POA: Diagnosis not present

## 2020-10-26 DIAGNOSIS — F251 Schizoaffective disorder, depressive type: Secondary | ICD-10-CM | POA: Diagnosis not present

## 2020-11-12 DIAGNOSIS — Z940 Kidney transplant status: Secondary | ICD-10-CM | POA: Diagnosis not present

## 2021-02-20 ENCOUNTER — Encounter (HOSPITAL_COMMUNITY): Payer: Self-pay | Admitting: *Deleted

## 2021-02-20 ENCOUNTER — Other Ambulatory Visit: Payer: Self-pay

## 2021-02-20 ENCOUNTER — Ambulatory Visit (HOSPITAL_COMMUNITY)
Admission: EM | Admit: 2021-02-20 | Discharge: 2021-02-20 | Disposition: A | Discharge status: Home / Self Care | Payer: Medicare Other

## 2021-02-20 DIAGNOSIS — M545 Low back pain, unspecified: Secondary | ICD-10-CM

## 2021-02-20 HISTORY — DX: Schizoaffective disorder, unspecified: F25.9

## 2021-02-20 LAB — POCT URINALYSIS DIPSTICK, ED / UC
Bilirubin Urine: NEGATIVE
Glucose, UA: NEGATIVE mg/dL
Hgb urine dipstick: NEGATIVE
Ketones, ur: NEGATIVE mg/dL
Leukocytes,Ua: NEGATIVE
Nitrite: NEGATIVE
Protein, ur: NEGATIVE mg/dL
Specific Gravity, Urine: 1.02 (ref 1.005–1.030)
Urobilinogen, UA: 1 mg/dL (ref 0.0–1.0)
pH: 8.5 — ABNORMAL HIGH (ref 5.0–8.0)

## 2021-02-20 MED ORDER — METHOCARBAMOL 500 MG PO TABS: 500.0000 mg | ORAL_TABLET | Freq: Two times a day (BID) | ORAL | 0 refills | Status: DC | PRN

## 2021-02-20 NOTE — Discharge Instructions (Addendum)
Take the muscle relaxer as needed for muscle spasm; Do not drive, operate machinery, or drink alcohol with this medication as it can cause drowsiness.   Follow up with your primary care provider if your symptoms are not improving.     

## 2021-02-20 NOTE — ED Triage Notes (Signed)
Pt reports left low back pain onset approx 5 days ago.  States he believes it's related to having to lift up his mother from a fall she had that day.  Denies radiation of pain.  States pain worse when he puts pressure on his leg, pain is completely relieved if non-weightbearing.

## 2021-02-20 NOTE — ED Provider Notes (Signed)
[facility]    CSN: [id number] Arrival date & time: 02/20/21  1652      History   Chief Complaint Chief Complaint  Patient presents with   Back Pain    HPI [name] is a 60 y.o. male.   Patient presents with left lower back pain x5 days.  He attributes this to lifting his mother up from a fall.  No radiation of pain.  The pain is worse with weightbearing and ambulation.  Treatment attempted at home with Advil.  He denies fever, chills, abdominal pain, dysuria, numbness, weakness, paresthesias, saddle anesthesia, loss of control of bowel/bladder, wounds, redness, or other symptoms.  His medical history includes schizoaffective disorder, auditory hallucinations, diabetes, diabetic kidney complication, renal failure, kidney transplant in 2009, hypertension.  The history is provided by the patient and medical records.    Past Medical History:  Diagnosis Date   Burn    Diabetes mellitus without complication (HCC)    Hypertension    Renal failure    Schizoaffective disorder (HCC)     There are no problems to display for this patient.   Past Surgical History:  Procedure Laterality Date   DIALYSIS FISTULA CREATION Right    KIDNEY TRANSPLANT Right 2009   SKIN GRAFT         Home Medications    Prior to Admission medications   Medication Sig Start Date End Date Taking? Authorizing Provider  amLODipine (NORVASC) 10 MG tablet Take 10 mg by mouth daily.   Yes [provider]  ARIPiprazole (ABILIFY MYCITE PO) Take by mouth.   Yes [provider]  ibuprofen (ADVIL,MOTRIN) 200 MG tablet Take 200 mg by mouth every 6 (six) hours as needed for fever.    Yes [provider]  methocarbamol (ROBAXIN) 500 MG tablet Take 1 tablet (500 mg total) by mouth 2 (two) times daily as needed for muscle spasms. 02/20/21  Yes Mickie Bail, NP  metoprolol (LOPRESSOR) 50 MG tablet Take 50 mg by mouth 2 (two) times daily.   Yes [provider]  mycophenolate (CELLCEPT) 250 MG capsule Take 750 mg by mouth 2 (two) times daily.   Yes [provider]  omeprazole (PRILOSEC) 20 MG capsule Take 20 mg by mouth daily.   Yes [provider]  predniSONE (DELTASONE) 5 MG tablet Take 1 tablet by mouth daily. 08/28/15  Yes [provider]  tacrolimus (PROGRAF) 1 MG capsule Take 3 mg by mouth 2 (two) times daily.   Yes [provider]  zolpidem (AMBIEN) 10 MG tablet TK 1 T PO QD HS 01/29/16  Yes [provider]  furosemide (LASIX) 40 MG tablet TK 1 T PO D 02/11/16   [provider]  HYDROcodone-acetaminophen (NORCO/VICODIN) 5-325 MG per tablet Take 1-2 tablets by mouth every 6 (six) hours as needed for moderate pain or severe pain. Patient not taking: No sig reported 08/21/15   Antony Madura, PA-C  loperamide (IMODIUM) 2 MG capsule Take 1 capsule (2 mg total) by mouth 4 (four) times daily as needed for diarrhea or loose stools. 02/23/16   Linwood Dibbles, MD  naproxen sodium (ANAPROX) 220 MG tablet Take 220 mg by mouth 2 (two) times daily as needed (pain).    [provider]  risperiDONE (RISPERDAL) 2 MG tablet TK 1 T PO  QD 12/29/15   [provider]    Family History Family History  Problem Relation Age of Onset   Diabetes Mother  Hypertension Mother    Diabetes Father     Social History Social History   Tobacco Use   Smoking status: Former Smoker   Smokeless tobacco: Never Used  Building services engineer Use: Never used  Substance Use Topics   Alcohol use: Yes    Comment: occasionally   Drug use: Never     Allergies   Patient has no known allergies.   Review of Systems Review of Systems  Constitutional: Negative for chills and fever.  HENT: Negative for ear pain and sore throat.   Eyes: Negative for pain and visual disturbance.  Respiratory: Negative for cough and shortness of breath.   Cardiovascular: Negative for chest pain and palpitations.   Gastrointestinal: Negative for abdominal pain and vomiting.  Genitourinary: Negative for dysuria and hematuria.  Musculoskeletal: Positive for back pain. Negative for arthralgias.  Skin: Negative for color change and rash.  Neurological: Negative for syncope, weakness and numbness.  All other systems reviewed and are negative.    Physical Exam Triage Vital Signs ED Triage Vitals [02/20/21 1704]  Enc Vitals Group     BP 137/87     Pulse Rate (!) 56     Resp (!) 22     Temp 98 F (36.7 C)     Temp Source Oral     SpO2 97 %     Weight      Height      Head Circumference      Peak Flow      Pain Score 0     Pain Loc      Pain Edu?      Excl. in GC?    No data found.  Updated Vital Signs BP 137/87    Pulse (!) 56    Temp 98 F (36.7 C) (Oral)    Resp (!) 22    SpO2 97%   Visual Acuity Right Eye Distance:   Left Eye Distance:   Bilateral Distance:    Right Eye Near:   Left Eye Near:    Bilateral Near:     Physical Exam Vitals and nursing note reviewed.  Constitutional:      Appearance: He is well-developed and well-nourished.  HENT:     Head: Normocephalic and atraumatic.     Mouth/Throat:     Mouth: Mucous membranes are moist.  Eyes:     Conjunctiva/sclera: Conjunctivae normal.  Cardiovascular:     Rate and Rhythm: Normal rate and regular rhythm.     Heart sounds: Normal heart sounds.  Pulmonary:     Effort: Pulmonary effort is normal. No respiratory distress.     Breath sounds: Normal breath sounds.  Abdominal:     Palpations: Abdomen is soft.     Tenderness: There is no abdominal tenderness. There is left CVA tenderness. There is no right CVA tenderness, guarding or rebound.  Musculoskeletal:        General: No swelling, tenderness or edema. Normal range of motion.     Cervical back: Neck supple.  Skin:    General: Skin is warm and dry.     Findings: No bruising, erythema, lesion or rash.  Neurological:     General: No focal deficit present.      Mental Status: He is alert and oriented to person, place, and time.     Sensory: No sensory deficit.     Motor: No weakness.     Gait: Gait normal.     Comments: Negative straight leg  raise.  Psychiatric:        Mood and Affect: Mood and affect and mood normal.        Behavior: Behavior normal.      UC Treatments / Results  Labs (all labs ordered are listed, but only abnormal results are displayed) Labs Reviewed  POCT URINALYSIS DIPSTICK, ED / UC - Abnormal; Notable for the following components:      Result Value   pH 8.5 (*)    All other components within normal limits    EKG   Radiology No results found.  Procedures Procedures (including critical care time)  Medications Ordered in UC Medications - No data to display  Initial Impression / Assessment and Plan / UC Course  I have reviewed the triage vital signs and the nursing notes.  Pertinent labs & imaging results that were available during my care of the patient were reviewed by me and considered in my medical decision making (see chart for details).   Acute left lower back pain without sciatica.  Urine does not show signs of infection.  Treating with Robaxin; precautions for drowsiness with this medication discussed.  Instructed patient to follow-up with his PCP if his symptoms are not improving.  He agrees to plan of care.   Final Clinical Impressions(s) / UC Diagnoses   Final diagnoses:  Acute left-sided low back pain without sciatica     Discharge Instructions     Take the muscle relaxer as needed for muscle spasm; Do not drive, operate machinery, or drink alcohol with this medication as it can cause drowsiness.   Follow up with your primary care provider if your symptoms are not improving.          ED Prescriptions    Medication Sig Dispense Auth. Provider   methocarbamol (ROBAXIN) 500 MG tablet Take 1 tablet (500 mg total) by mouth 2 (two) times daily as needed for muscle spasms. 10 tablet Mickie Bail, NP     I have reviewed the PDMP during this encounter.   Mickie Bail, NP 02/20/21 [phone]

## 2023-02-16 ENCOUNTER — Ambulatory Visit (INDEPENDENT_AMBULATORY_CARE_PROVIDER_SITE_OTHER): Payer: Medicare (Managed Care) | Admitting: Podiatry

## 2023-02-16 ENCOUNTER — Encounter: Payer: Self-pay | Admitting: Podiatry

## 2023-02-16 DIAGNOSIS — E1142 Type 2 diabetes mellitus with diabetic polyneuropathy: Secondary | ICD-10-CM

## 2023-02-16 DIAGNOSIS — B351 Tinea unguium: Secondary | ICD-10-CM

## 2023-02-16 DIAGNOSIS — M79674 Pain in right toe(s): Secondary | ICD-10-CM

## 2023-02-16 DIAGNOSIS — M79675 Pain in left toe(s): Secondary | ICD-10-CM

## 2023-02-16 NOTE — Progress Notes (Signed)
  Subjective:  Patient ID: [name], male    DOB: Feb 22, 1961,  MRN: [id number]  Chief Complaint  Patient presents with   Diabetes    Diabetic foot exam - requesting nail care, last A1c was 14.0   New Patient (Initial Visit)    62 y.o. male presents with the above complaint. History confirmed with patient. Patient presenting with pain related to dystrophic thickened elongated nails. Patient is unable to trim own nails related to nail dystrophy and/or mobility issues. Patient does have a history of T2DM. Has not had nails trimmed in a long time. Also sent for routine DM exam  Objective:  Physical Exam: warm, good capillary refill nail exam onychomycosis of the toenails, onycholysis, and dystrophic nails DP pulses palpable, PT pulses palpable, and protective sensation intact Left Foot:  Pain with palpation of nails due to elongation and dystrophic growth.  Right Foot: Pain with palpation of nails due to elongation and dystrophic growth.   Assessment:   1. Pain due to onychomycosis of toenails of both feet   2. DM type 2 with diabetic peripheral neuropathy ([place])      Plan:  Patient was evaluated and treated and all questions answered.  #DM 2 with neuropathy Patient educated on diabetes. Discussed proper diabetic foot care and discussed risks and complications of disease. Educated patient in depth on reasons to return to the office immediately should he/she discover anything concerning or new on the feet. All questions answered. Discussed proper shoes as well.   #Onychomycosis with pain  -Nails palliatively debrided as below. -Educated on self-care  Procedure: Nail Debridement Rationale: Pain Type of Debridement: manual, sharp debridement. Instrumentation: Nail nipper, rotary burr. Number of Nails: 10  Return in about 3 months (around 05/17/2023) for [facility].         Everitt Amber, DPM [facility]

## 2023-05-18 ENCOUNTER — Ambulatory Visit (INDEPENDENT_AMBULATORY_CARE_PROVIDER_SITE_OTHER): Payer: Medicare (Managed Care) | Admitting: Podiatry

## 2023-05-18 DIAGNOSIS — M79674 Pain in right toe(s): Secondary | ICD-10-CM

## 2023-05-18 DIAGNOSIS — M79675 Pain in left toe(s): Secondary | ICD-10-CM | POA: Diagnosis not present

## 2023-05-18 DIAGNOSIS — B351 Tinea unguium: Secondary | ICD-10-CM | POA: Diagnosis not present

## 2023-05-18 DIAGNOSIS — E1142 Type 2 diabetes mellitus with diabetic polyneuropathy: Secondary | ICD-10-CM | POA: Diagnosis not present

## 2023-05-18 NOTE — Progress Notes (Signed)
  Subjective:  Patient ID: [name], male    DOB: Mar 15, 1961,  MRN: [id number]  Chief Complaint  Patient presents with   Nail Problem    Diabetic Foot Care     62 y.o. male presents with the above complaint. History confirmed with patient. Patient presenting with pain related to dystrophic thickened elongated nails. Patient is unable to trim own nails related to nail dystrophy and/or mobility issues. Patient does have a history of T2DM. Has not had nails trimmed in a long time. Also sent for routine DM exam  Objective:  Physical Exam: warm, good capillary refill nail exam onychomycosis of the toenails, onycholysis, and dystrophic nails DP pulses palpable, PT pulses palpable, and protective sensation intact Left Foot:  Pain with palpation of nails due to elongation and dystrophic growth.  Right Foot: Pain with palpation of nails due to elongation and dystrophic growth.   Assessment:   1. Pain due to onychomycosis of toenails of both feet   2. DM type 2 with diabetic peripheral neuropathy (HCC)       Plan:  Patient was evaluated and treated and all questions answered.  #Onychomycosis with pain  -Nails palliatively debrided as below. -Educated on self-care  Procedure: Nail Debridement Rationale: Pain Type of Debridement: manual, sharp debridement. Instrumentation: Nail nipper, rotary burr. Number of Nails: 10  Return in about 3 months (around 08/18/2023) for [facility].         Corinna Gab, DPM [facility]

## 2023-05-19 ENCOUNTER — Other Ambulatory Visit: Payer: Self-pay | Admitting: Nephrology

## 2023-05-19 DIAGNOSIS — Z940 Kidney transplant status: Secondary | ICD-10-CM

## 2023-05-19 DIAGNOSIS — M549 Dorsalgia, unspecified: Secondary | ICD-10-CM

## 2023-05-19 DIAGNOSIS — Z6825 Body mass index (BMI) 25.0-25.9, adult: Secondary | ICD-10-CM

## 2023-05-19 DIAGNOSIS — E785 Hyperlipidemia, unspecified: Secondary | ICD-10-CM

## 2023-05-19 DIAGNOSIS — I129 Hypertensive chronic kidney disease with stage 1 through stage 4 chronic kidney disease, or unspecified chronic kidney disease: Secondary | ICD-10-CM

## 2023-05-19 DIAGNOSIS — Z8639 Personal history of other endocrine, nutritional and metabolic disease: Secondary | ICD-10-CM

## 2023-05-19 DIAGNOSIS — R3589 Other polyuria: Secondary | ICD-10-CM

## 2023-05-19 DIAGNOSIS — R440 Auditory hallucinations: Secondary | ICD-10-CM

## 2023-05-19 DIAGNOSIS — D849 Immunodeficiency, unspecified: Secondary | ICD-10-CM

## 2023-05-30 ENCOUNTER — Other Ambulatory Visit: Payer: Self-pay | Admitting: Nephrology

## 2023-05-30 ENCOUNTER — Encounter: Payer: Self-pay | Admitting: Nephrology

## 2023-05-30 ENCOUNTER — Other Ambulatory Visit: Payer: Medicare (Managed Care)

## 2023-05-30 ENCOUNTER — Other Ambulatory Visit: Payer: Self-pay

## 2023-05-30 DIAGNOSIS — R440 Auditory hallucinations: Secondary | ICD-10-CM

## 2023-05-30 DIAGNOSIS — E785 Hyperlipidemia, unspecified: Secondary | ICD-10-CM

## 2023-05-30 DIAGNOSIS — I129 Hypertensive chronic kidney disease with stage 1 through stage 4 chronic kidney disease, or unspecified chronic kidney disease: Secondary | ICD-10-CM

## 2023-05-30 DIAGNOSIS — Z6825 Body mass index (BMI) 25.0-25.9, adult: Secondary | ICD-10-CM

## 2023-05-30 DIAGNOSIS — M549 Dorsalgia, unspecified: Secondary | ICD-10-CM

## 2023-05-30 DIAGNOSIS — D849 Immunodeficiency, unspecified: Secondary | ICD-10-CM

## 2023-05-30 DIAGNOSIS — Z940 Kidney transplant status: Secondary | ICD-10-CM

## 2023-05-30 DIAGNOSIS — R3589 Other polyuria: Secondary | ICD-10-CM

## 2023-05-30 DIAGNOSIS — Z8639 Personal history of other endocrine, nutritional and metabolic disease: Secondary | ICD-10-CM

## 2023-08-24 ENCOUNTER — Ambulatory Visit (INDEPENDENT_AMBULATORY_CARE_PROVIDER_SITE_OTHER): Payer: Medicare (Managed Care) | Admitting: Podiatry

## 2023-08-24 DIAGNOSIS — Z91199 Patient's noncompliance with other medical treatment and regimen due to unspecified reason: Secondary | ICD-10-CM

## 2023-08-24 NOTE — Progress Notes (Signed)
No show

## 2023-10-04 ENCOUNTER — Ambulatory Visit (INDEPENDENT_AMBULATORY_CARE_PROVIDER_SITE_OTHER): Payer: Medicare (Managed Care) | Admitting: Podiatry

## 2023-10-04 DIAGNOSIS — B351 Tinea unguium: Secondary | ICD-10-CM

## 2023-10-04 DIAGNOSIS — M79674 Pain in right toe(s): Secondary | ICD-10-CM | POA: Diagnosis not present

## 2023-10-04 DIAGNOSIS — E1142 Type 2 diabetes mellitus with diabetic polyneuropathy: Secondary | ICD-10-CM

## 2023-10-04 DIAGNOSIS — M79675 Pain in left toe(s): Secondary | ICD-10-CM | POA: Diagnosis not present

## 2023-10-04 NOTE — Progress Notes (Addendum)
This patient returns to my office for at risk foot care.  This patient requires this care by a professional since this patient will be at risk due to having  diabetes. This patient is unable to cut nails himself since the patient cannot reach his nails.These nails are painful walking and wearing shoes.  This patient presents for at risk foot care today.  General Appearance  Alert, conversant and in no acute stress.  Vascular  Dorsalis pedis and posterior tibial  pulses are palpable  bilaterally.  Capillary return is within normal limits  bilaterally. Temperature is within normal limits  bilaterally.  Neurologic  Senn-Weinstein monofilament wire test within normal limits  bilaterally. Muscle power within normal limits bilaterally.  Nails Thick disfigured discolored nails with subungual debris  from hallux to fifth toes bilaterally. No evidence of bacterial infection or drainage bilaterally.  Orthopedic  No limitations of motion  feet .  No crepitus or effusions noted.  No bony pathology or digital deformities noted.  Skin  normotropic skin with no porokeratosis noted bilaterally.  No signs of infections or ulcers noted.     Onychomycosis  Pain in right toes  Pain in left toes  Consent was obtained for treatment procedures.   Mechanical debridement of nails 1-5  bilaterally performed with a nail nipper.  Filed with dremel without incident.    Return office visit                     Told patient to return for periodic foot care and evaluation due to potential at risk complications.   Helane Gunther DPM

## 2024-01-08 ENCOUNTER — Ambulatory Visit (INDEPENDENT_AMBULATORY_CARE_PROVIDER_SITE_OTHER): Payer: Medicare (Managed Care) | Admitting: Podiatry

## 2024-01-08 ENCOUNTER — Encounter: Payer: Self-pay | Admitting: Podiatry

## 2024-01-08 DIAGNOSIS — M79675 Pain in left toe(s): Secondary | ICD-10-CM | POA: Diagnosis not present

## 2024-01-08 DIAGNOSIS — M79674 Pain in right toe(s): Secondary | ICD-10-CM

## 2024-01-08 DIAGNOSIS — B351 Tinea unguium: Secondary | ICD-10-CM | POA: Diagnosis not present

## 2024-01-08 DIAGNOSIS — E1142 Type 2 diabetes mellitus with diabetic polyneuropathy: Secondary | ICD-10-CM

## 2024-01-08 NOTE — Progress Notes (Signed)
This patient returns to my office for at risk foot care.  This patient requires this care by a professional since this patient will be at risk due to having diabetes.  This patient is unable to cut nails himself since the patient cannot reach his nails.These nails are painful walking and wearing shoes.  This patient presents for at risk foot care today.  General Appearance  Alert, conversant and in no acute stress.  Vascular  Dorsalis pedis and posterior tibial  pulses are palpable  bilaterally.  Capillary return is within normal limits  bilaterally. Temperature is within normal limits  bilaterally.  Neurologic  Senn-Weinstein monofilament wire test within normal limits  bilaterally. Muscle power within normal limits bilaterally.  Nails Thick disfigured discolored nails with subungual debris  from hallux to fifth toes bilaterally. No evidence of bacterial infection or drainage bilaterally.  Orthopedic  No limitations of motion  feet .  No crepitus or effusions noted.  No bony pathology or digital deformities noted.  Skin  normotropic skin with no porokeratosis noted bilaterally.  No signs of infections or ulcers noted.     Onychomycosis  Pain in right toes  Pain in left toes  Consent was obtained for treatment procedures.   Mechanical debridement of nails 1-5  bilaterally performed with a nail nipper.  Filed with dremel without incident.    Return office visit  10 weeks                    Told patient to return for periodic foot care and evaluation due to potential at risk complications.   Reesa Gotschall DPM   

## 2024-03-11 ENCOUNTER — Telehealth: Payer: Self-pay

## 2024-03-11 ENCOUNTER — Telehealth: Payer: Self-pay | Admitting: Neurology

## 2024-03-11 NOTE — Telephone Encounter (Signed)
 Call pt to bring his CPAP machine, LVM to give [place] a call back.

## 2024-03-11 NOTE — Telephone Encounter (Signed)
 Pt cancelled appt, Could not find old CPAP machine, will call back to reschedule when find machine.

## 2024-03-12 ENCOUNTER — Institutional Professional Consult (permissible substitution): Payer: Medicare (Managed Care) | Admitting: Neurology

## 2024-03-25 DIAGNOSIS — Z940 Kidney transplant status: Secondary | ICD-10-CM | POA: Insufficient documentation

## 2024-03-26 ENCOUNTER — Ambulatory Visit: Payer: Medicare (Managed Care) | Admitting: Podiatry

## 2024-06-25 ENCOUNTER — Encounter: Payer: Self-pay | Admitting: Podiatry

## 2024-06-25 ENCOUNTER — Ambulatory Visit: Payer: Medicare (Managed Care) | Admitting: Podiatry

## 2024-06-25 VITALS — Ht 72.0 in | Wt 212.0 lb

## 2024-06-25 DIAGNOSIS — E119 Type 2 diabetes mellitus without complications: Secondary | ICD-10-CM | POA: Insufficient documentation

## 2024-06-25 DIAGNOSIS — N401 Enlarged prostate with lower urinary tract symptoms: Secondary | ICD-10-CM | POA: Insufficient documentation

## 2024-06-25 DIAGNOSIS — F209 Schizophrenia, unspecified: Secondary | ICD-10-CM | POA: Insufficient documentation

## 2024-06-25 DIAGNOSIS — E785 Hyperlipidemia, unspecified: Secondary | ICD-10-CM | POA: Insufficient documentation

## 2024-06-25 DIAGNOSIS — N186 End stage renal disease: Secondary | ICD-10-CM | POA: Insufficient documentation

## 2024-06-25 DIAGNOSIS — I10 Essential (primary) hypertension: Secondary | ICD-10-CM | POA: Insufficient documentation

## 2024-06-25 DIAGNOSIS — M79675 Pain in left toe(s): Secondary | ICD-10-CM

## 2024-06-25 DIAGNOSIS — M79674 Pain in right toe(s): Secondary | ICD-10-CM | POA: Diagnosis not present

## 2024-06-25 DIAGNOSIS — Z6830 Body mass index (BMI) 30.0-30.9, adult: Secondary | ICD-10-CM | POA: Insufficient documentation

## 2024-06-25 DIAGNOSIS — G4700 Insomnia, unspecified: Secondary | ICD-10-CM | POA: Insufficient documentation

## 2024-06-25 DIAGNOSIS — G4733 Obstructive sleep apnea (adult) (pediatric): Secondary | ICD-10-CM | POA: Insufficient documentation

## 2024-06-25 DIAGNOSIS — D849 Immunodeficiency, unspecified: Secondary | ICD-10-CM | POA: Insufficient documentation

## 2024-06-25 DIAGNOSIS — B351 Tinea unguium: Secondary | ICD-10-CM | POA: Diagnosis not present

## 2024-06-27 NOTE — Progress Notes (Signed)
  Subjective:  Patient ID: [name], male    DOB: 1961/06/26,  MRN: [id number]  Chief Complaint  Patient presents with   Debridement    Requesting toenail trim-diabetic - 7.6    63 y.o. male presents with the above complaint. History confirmed with patient.  His nails are thick and elongated causing pain and discomfort.  He last saw Dr. Loreda in January for this  Objective:  Physical Exam: warm, good capillary refill, no trophic changes or ulcerative lesions, normal DP and PT pulses, and normal sensory exam. Left Foot: dystrophic yellowed discolored nail plates with subungual debris Right Foot: dystrophic yellowed discolored nail plates with subungual debris  Assessment:   1. Pain due to onychomycosis of toenails of both feet      Plan:  Patient was evaluated and treated and all questions answered.  Discussed the etiology and treatment options for the condition in detail with the patient. Recommended debridement of the nails today. Sharp and mechanical debridement performed of all painful and mycotic nails today. Nails debrided in length and thickness using a nail nipper to level of comfort. Follow up as needed for painful nails.    Return in about 3 months (around 09/25/2024) for at risk diabetic foot care.

## 2024-07-22 NOTE — Progress Notes (Shared)
 [facility] - Clinic Note  07/23/2024     CHIEF COMPLAINT Patient presents for No chief complaint on file.   HISTORY OF PRESENT ILLNESS: [name] is a 63 y.o. male who presents to the clinic today for:     Referring physician: Valentin Skates, DO [address] [place],  [place] [address]  HISTORICAL INFORMATION:   Selected notes from the MEDICAL RECORD NUMBER Referred by Dr. JAMA:  Ocular Hx- PMH-    CURRENT MEDICATIONS: No current outpatient medications on file. (Ophthalmic Drugs)   No current facility-administered medications for this visit. (Ophthalmic Drugs)   Current Outpatient Medications (Other)  Medication Sig   amLODipine  (NORVASC ) 10 MG tablet Take 10 mg by mouth daily.   ARIPiprazole (ABILIFY MYCITE PO) Take by mouth.   atorvastatin (LIPITOR) 20 MG tablet Take 20 mg by mouth daily.   DROPLET PEN NEEDLES 31G X 5 MM MISC 2 (two) times daily.   furosemide  (LASIX ) 40 MG tablet TK 1 T PO D   ibuprofen (ADVIL,MOTRIN) 200 MG tablet Take 200 mg by mouth every 6 (six) hours as needed for fever.    insulin glargine (LANTUS) 100 UNIT/ML Solostar Pen inject 8 units subcutaneously daily and for fasting blood sugars over 150 for 3 days in a row increase by 2 units to a maximum of 22 units Subcutaneous for 30 days   Isopropyl Alcohol (ALCOHOL WIPES) 70 % MISC as directed to check blood sugars Externally three times daily for 30 days   JANUVIA 100 MG tablet Take 100 mg by mouth daily.   LANTUS SOLOSTAR 100 UNIT/ML Solostar Pen Inject into the skin.   Magnesium Gluconate (MAGNESIUM 27 PO) Magnesium   metFORMIN (GLUCOPHAGE-XR) 500 MG 24 hr tablet 1 tablet with evening meal Orally Once a day for 30 day(s)   metoprolol  (LOPRESSOR ) 50 MG tablet Take 50 mg by mouth 2 (two) times daily.   mycophenolate  (CELLCEPT ) 250 MG capsule Take 750 mg by mouth 2 (two) times daily.   naproxen  sodium (ANAPROX ) 220 MG tablet Take 220 mg by mouth 2 (two) times daily as needed  (pain).   omeprazole (PRILOSEC) 20 MG capsule Take 20 mg by mouth daily.   predniSONE  (DELTASONE ) 5 MG tablet Take 1 tablet by mouth daily.   risperiDONE (RISPERDAL) 2 MG tablet TK 1 T PO  QD   tacrolimus  (PROGRAF ) 1 MG capsule Take 3 mg by mouth 2 (two) times daily.   tamsulosin (FLOMAX) 0.4 MG CAPS capsule 1 capsule Orally Once a day   zolpidem  (AMBIEN ) 10 MG tablet TK 1 T PO QD HS   No current facility-administered medications for this visit. (Other)      REVIEW OF SYSTEMS:    ALLERGIES No Known Allergies  PAST MEDICAL HISTORY Past Medical History:  Diagnosis Date   Burn    Diabetes mellitus without complication (HCC)    Hypertension    Renal failure    Schizoaffective disorder (HCC)    Past Surgical History:  Procedure Laterality Date   DIALYSIS FISTULA CREATION Right    KIDNEY TRANSPLANT Right 2009   SKIN GRAFT      FAMILY HISTORY Family History  Problem Relation Age of Onset   Diabetes Mother    Hypertension Mother    Diabetes Father     SOCIAL HISTORY Social History   Tobacco Use   Smoking status: Former   Smokeless tobacco: Never  Vaping Use   Vaping status: Never Used  Substance Use Topics  Alcohol use: Yes    Comment: occasionally   Drug use: Never         OPHTHALMIC EXAM:  Not recorded     IMAGING AND PROCEDURES  Imaging and Procedures for 07/23/2024           ASSESSMENT/PLAN:    ICD-10-CM   1. Retinal edema  H35.81       1.  2.  3.  Ophthalmic Meds Ordered this visit:  No orders of the defined types were placed in this encounter.      No follow-ups on file.  There are no Patient Instructions on file for this visit.   Explained the diagnoses, plan, and follow up with the patient and they expressed understanding.  Patient expressed understanding of the importance of proper follow up care.   This document serves as a record of services personally performed by Redell JUDITHANN Hans, MD, PhD. It was created on  their behalf by Auston Muzzy, COMT. The creation of this record is the provider's dictation and/or activities during the visit.  Electronically signed by: Auston Muzzy, COMT 07/22/24 1:58 PM    Redell JUDITHANN Hans, M.D., Ph.D. Diseases & Surgery of the Retina and Vitreous [facility] @TODAY @     Abbreviations: M myopia (nearsighted); A astigmatism; H hyperopia (farsighted); P presbyopia; Mrx spectacle prescription;  CTL contact lenses; OD right eye; OS left eye; OU both eyes  XT exotropia; ET esotropia; PEK punctate epithelial keratitis; PEE punctate epithelial erosions; DES dry eye syndrome; MGD meibomian gland dysfunction; ATs artificial tears; PFAT's preservative free artificial tears; NSC nuclear sclerotic cataract; PSC posterior subcapsular cataract; ERM epi-retinal membrane; PVD posterior vitreous detachment; RD retinal detachment; DM diabetes mellitus; DR diabetic retinopathy; NPDR non-proliferative diabetic retinopathy; PDR proliferative diabetic retinopathy; CSME clinically significant macular edema; DME diabetic macular edema; dbh dot blot hemorrhages; CWS cotton wool spot; POAG primary open angle glaucoma; C/D cup-to-disc ratio; HVF humphrey visual field; GVF goldmann visual field; OCT optical coherence tomography; IOP intraocular pressure; BRVO Branch retinal vein occlusion; CRVO central retinal vein occlusion; CRAO central retinal artery occlusion; BRAO branch retinal artery occlusion; RT retinal tear; SB scleral buckle; PPV pars plana vitrectomy; VH Vitreous hemorrhage; PRP panretinal laser photocoagulation; IVK intravitreal kenalog; VMT vitreomacular traction; MH Macular hole;  NVD neovascularization of the disc; NVE neovascularization elsewhere; AREDS age related eye disease study; ARMD age related macular degeneration; POAG primary open angle glaucoma; EBMD epithelial/anterior basement membrane dystrophy; ACIOL anterior chamber intraocular lens; IOL intraocular  lens; PCIOL posterior chamber intraocular lens; Phaco/IOL phacoemulsification with intraocular lens placement; PRK photorefractive keratectomy; LASIK laser assisted in situ keratomileusis; HTN hypertension; DM diabetes mellitus; COPD chronic obstructive pulmonary disease

## 2024-07-23 ENCOUNTER — Encounter (INDEPENDENT_AMBULATORY_CARE_PROVIDER_SITE_OTHER): Payer: Medicare (Managed Care) | Admitting: Ophthalmology

## 2024-07-23 DIAGNOSIS — H3581 Retinal edema: Secondary | ICD-10-CM

## 2024-07-23 NOTE — Progress Notes (Signed)
 [facility] - Clinic Note  07/29/2024   CHIEF COMPLAINT Patient presents for Retina Evaluation  HISTORY OF PRESENT ILLNESS: [name] is a 63 y.o. male who presents to the clinic today for:  HPI     Retina Evaluation   In both eyes.  I, the attending physician,  performed the HPI with the patient and updated documentation appropriately.        Comments   Patient here for Retina Evaluation. Referred by Dr Angelia for DM exam. Patient states vision can see far away. Up close needs readers. No eye pain.       Last edited by Valdemar Rogue, MD on 07/29/2024  5:09 PM.    Pt is here on the referral of Dr. Angelia for DM exam, he states his last A1c was 7.6 a couple months ago  Referring physician: Angelia Pierce, NP [address] [place],  [place] [address]  HISTORICAL INFORMATION:  Selected notes from the MEDICAL RECORD NUMBER Referred by Pierce Angelia, NP for DM exam LEE:  Ocular Hx- PMH-   CURRENT MEDICATIONS: No current outpatient medications on file. (Ophthalmic Drugs)   No current facility-administered medications for this visit. (Ophthalmic Drugs)   Current Outpatient Medications (Other)  Medication Sig   amLODipine  (NORVASC ) 10 MG tablet Take 10 mg by mouth daily.   atorvastatin (LIPITOR) 20 MG tablet Take 20 mg by mouth daily.   DROPLET PEN NEEDLES 31G X 5 MM MISC 2 (two) times daily.   furosemide  (LASIX ) 40 MG tablet TK 1 T PO D   ibuprofen (ADVIL,MOTRIN) 200 MG tablet Take 200 mg by mouth every 6 (six) hours as needed for fever.    insulin glargine (LANTUS) 100 UNIT/ML Solostar Pen inject 8 units subcutaneously daily and for fasting blood sugars over 150 for 3 days in a row increase by 2 units to a maximum of 22 units Subcutaneous for 30 days   Isopropyl Alcohol (ALCOHOL WIPES) 70 % MISC as directed to check blood sugars Externally three times daily for 30 days   JANUVIA 100 MG tablet Take 100 mg by mouth daily.   Magnesium Gluconate  (MAGNESIUM 27 PO) Magnesium   metFORMIN (GLUCOPHAGE-XR) 500 MG 24 hr tablet 1 tablet with evening meal Orally Once a day for 30 day(s)   metoprolol  (LOPRESSOR ) 50 MG tablet Take 50 mg by mouth 2 (two) times daily.   mycophenolate  (CELLCEPT ) 250 MG capsule Take 750 mg by mouth 2 (two) times daily.   naproxen  sodium (ANAPROX ) 220 MG tablet Take 220 mg by mouth 2 (two) times daily as needed (pain).   omeprazole (PRILOSEC) 20 MG capsule Take 20 mg by mouth daily.   predniSONE  (DELTASONE ) 5 MG tablet Take 1 tablet by mouth daily.   risperiDONE (RISPERDAL) 2 MG tablet TK 1 T PO  QD   tacrolimus  (PROGRAF ) 1 MG capsule Take 3 mg by mouth 2 (two) times daily.   tamsulosin (FLOMAX) 0.4 MG CAPS capsule 1 capsule Orally Once a day   zolpidem  (AMBIEN ) 10 MG tablet TK 1 T PO QD HS   ARIPiprazole (ABILIFY MYCITE PO) Take by mouth. (Patient not taking: Reported on 07/29/2024)   LANTUS SOLOSTAR 100 UNIT/ML Solostar Pen Inject into the skin. (Patient not taking: Reported on 07/29/2024)   No current facility-administered medications for this visit. (Other)   REVIEW OF SYSTEMS: ROS   Positive for: Endocrine, Eyes Last edited by Orval Asberry RAMAN, COA on 07/29/2024  2:01 PM.  ALLERGIES No Known Allergies PAST MEDICAL HISTORY Past Medical History:  Diagnosis Date   Burn    Diabetes mellitus without complication (HCC)    Hypertension    Renal failure    Schizoaffective disorder (HCC)    Past Surgical History:  Procedure Laterality Date   DIALYSIS FISTULA CREATION Right    KIDNEY TRANSPLANT Right 2009   SKIN GRAFT     FAMILY HISTORY Family History  Problem Relation Age of Onset   Diabetes Mother    Hypertension Mother    Diabetes Father    SOCIAL HISTORY Social History   Tobacco Use   Smoking status: Former   Smokeless tobacco: Never  Advertising account planner   Vaping status: Never Used  Substance Use Topics   Alcohol use: Yes    Comment: occasionally   Drug use: Never       OPHTHALMIC  EXAM:  Base Eye Exam     Visual Acuity (Snellen - Linear)       Right Left   Dist Center 20/30 -1 20/20   Dist ph [place] 20/20          Tonometry (Tonopen, 1:59 PM)       Right Left   Pressure 14 13         Pupils       Dark Light Shape React APD   Right 2 1 Round Brisk None   Left 2 1 Round Brisk None         Visual Fields (Counting fingers)       Left Right    Full Full         Extraocular Movement       Right Left    Full, Ortho Full, Ortho         Neuro/Psych     Oriented x3: Yes   Mood/Affect: Normal         Dilation     Both eyes: 1.0% Mydriacyl @ 1:59 PM           Slit Lamp and Fundus Exam     Slit Lamp Exam       Right Left   Lids/Lashes Dermatochalasis - upper lid Dermatochalasis - upper lid   Conjunctiva/Sclera Melanosis Melanosis   Cornea arcus arcus   Anterior Chamber deep and clear deep and clear   Iris Round and dilated Round and dilated   Lens 2-3+ Nuclear sclerosis, 2+ Cortical cataract 2-3+ Nuclear sclerosis, 2+ Cortical cataract   Anterior Vitreous mild syneresis mild syneresis         Fundus Exam       Right Left   Disc Pink and Sharp, +PPP Pink and Sharp   C/D Ratio 0.5 0.6   Macula Flat, Good foveal reflex, focal druse inferior mac, No heme or edema Flat, Good foveal reflex, RPE mottling, No heme or edema   Vessels attenuated, mild copper wiring, mild tortuosity attenuated, mild copper wiring, mild tortuosity   Periphery Attached Attached           IMAGING AND PROCEDURES  Imaging and Procedures for 07/29/2024  OCT, Retina - OU - Both Eyes       Right Eye Quality was good. Central Foveal Thickness: 270. Progression has no prior data. Findings include normal foveal contour, no IRF, no SRF (Focal druse IN mac).   Left Eye Quality was good. Central Foveal Thickness: 271. Progression has no prior data. Findings include normal foveal contour, no IRF, no SRF, inner retinal atrophy (Focal IRA  nasal macula).    Notes *Images captured and stored on drive  Diagnosis / Impression:  NFP, no IRF / SRF OU No DME OU OS: Focal IRA nasal macula  Clinical management:  See below  Abbreviations: NFP - Normal foveal profile. CME - cystoid macular edema. PED - pigment epithelial detachment. IRF - intraretinal fluid. SRF - subretinal fluid. EZ - ellipsoid zone. ERM - epiretinal membrane. ORA - outer retinal atrophy. ORT - outer retinal tubulation. SRHM - subretinal hyper-reflective material. IRHM - intraretinal hyper-reflective material           ASSESSMENT/PLAN:   ICD-10-CM   1. Diabetes mellitus type 2 without retinopathy (HCC)  E11.9 OCT, Retina - OU - Both Eyes    2. Current use of insulin (HCC)  Z79.4     3. Long term (current) use of oral hypoglycemic drugs  Z79.84     4. Essential hypertension  I10     5. Hypertensive retinopathy of both eyes  H35.033     6. Combined forms of age-related cataract of both eyes  H25.813      1-3. Diabetes mellitus, type 2 without retinopathy  - most recent A1c 8.1 on 03.11.2025 - The incidence, risk factors for progression, natural history and treatment options for diabetic retinopathy  were discussed with patient.   - The need for close monitoring of blood glucose, blood pressure, and serum lipids, avoiding cigarette or any type of tobacco, and the need for long term follow up was also discussed with patient. - f/u in 1 year, sooner prn  4,5. Hypertensive retinopathy OU - discussed importance of tight BP control - monitor  6. Mixed Cataract OU - The symptoms of cataract, surgical options, and treatments and risks were discussed with patient. - discussed diagnosis and progression - monitor  Ophthalmic Meds Ordered this visit:  No orders of the defined types were placed in this encounter.    Return in about 1 year (around 07/29/2025) for DM exam, DFE, OCT.  There are no Patient Instructions on file for this visit.  Explained the diagnoses,  plan, and follow up with the patient and they expressed understanding.  Patient expressed understanding of the importance of proper follow up care.   This document serves as a record of services personally performed by Redell JUDITHANN Hans, MD, PhD. It was created on their behalf by Alan PARAS. Delores, OA an ophthalmic technician. The creation of this record is the provider's dictation and/or activities during the visit.    Electronically signed by: Alan PARAS. Delores, OA 07/29/24 5:11 PM  Redell JUDITHANN Hans, M.D., Ph.D. Diseases & Surgery of the Retina and Vitreous [facility] 07/29/2024  I have reviewed the above documentation for accuracy and completeness, and I agree with the above. Redell JUDITHANN Hans, M.D., Ph.D. 07/29/24 5:13 PM   Abbreviations: M myopia (nearsighted); A astigmatism; H hyperopia (farsighted); P presbyopia; Mrx spectacle prescription;  CTL contact lenses; OD right eye; OS left eye; OU both eyes  XT exotropia; ET esotropia; PEK punctate epithelial keratitis; PEE punctate epithelial erosions; DES dry eye syndrome; MGD meibomian gland dysfunction; ATs artificial tears; PFAT's preservative free artificial tears; NSC nuclear sclerotic cataract; PSC posterior subcapsular cataract; ERM epi-retinal membrane; PVD posterior vitreous detachment; RD retinal detachment; DM diabetes mellitus; DR diabetic retinopathy; NPDR non-proliferative diabetic retinopathy; PDR proliferative diabetic retinopathy; CSME clinically significant macular edema; DME diabetic macular edema; dbh dot blot hemorrhages; CWS cotton wool spot; POAG primary open angle glaucoma; C/D cup-to-disc  ratio; HVF humphrey visual field; GVF goldmann visual field; OCT optical coherence tomography; IOP intraocular pressure; BRVO Branch retinal vein occlusion; CRVO central retinal vein occlusion; CRAO central retinal artery occlusion; BRAO branch retinal artery occlusion; RT retinal tear; SB scleral buckle; PPV pars plana  vitrectomy; VH Vitreous hemorrhage; PRP panretinal laser photocoagulation; IVK intravitreal kenalog; VMT vitreomacular traction; MH Macular hole;  NVD neovascularization of the disc; NVE neovascularization elsewhere; AREDS age related eye disease study; ARMD age related macular degeneration; POAG primary open angle glaucoma; EBMD epithelial/anterior basement membrane dystrophy; ACIOL anterior chamber intraocular lens; IOL intraocular lens; PCIOL posterior chamber intraocular lens; Phaco/IOL phacoemulsification with intraocular lens placement; PRK photorefractive keratectomy; LASIK laser assisted in situ keratomileusis; HTN hypertension; DM diabetes mellitus; COPD chronic obstructive pulmonary disease

## 2024-07-29 ENCOUNTER — Encounter (INDEPENDENT_AMBULATORY_CARE_PROVIDER_SITE_OTHER): Payer: Self-pay | Admitting: Ophthalmology

## 2024-07-29 ENCOUNTER — Ambulatory Visit (INDEPENDENT_AMBULATORY_CARE_PROVIDER_SITE_OTHER): Payer: Medicare (Managed Care) | Admitting: Ophthalmology

## 2024-07-29 DIAGNOSIS — I10 Essential (primary) hypertension: Secondary | ICD-10-CM

## 2024-07-29 DIAGNOSIS — Z794 Long term (current) use of insulin: Secondary | ICD-10-CM

## 2024-07-29 DIAGNOSIS — Z7984 Long term (current) use of oral hypoglycemic drugs: Secondary | ICD-10-CM

## 2024-07-29 DIAGNOSIS — H35033 Hypertensive retinopathy, bilateral: Secondary | ICD-10-CM

## 2024-07-29 DIAGNOSIS — E119 Type 2 diabetes mellitus without complications: Secondary | ICD-10-CM | POA: Diagnosis not present

## 2024-07-29 DIAGNOSIS — H25813 Combined forms of age-related cataract, bilateral: Secondary | ICD-10-CM

## 2024-07-29 DIAGNOSIS — H3581 Retinal edema: Secondary | ICD-10-CM

## 2024-09-26 ENCOUNTER — Ambulatory Visit: Payer: Medicare (Managed Care) | Admitting: Podiatry

## 2025-07-29 ENCOUNTER — Encounter (INDEPENDENT_AMBULATORY_CARE_PROVIDER_SITE_OTHER): Payer: Medicare (Managed Care) | Admitting: Ophthalmology
